# Patient Record
Sex: Male | Born: 1955 | Race: White | Hispanic: No | Marital: Single | Smoking: Never smoker
Health system: Southern US, Community
[De-identification: ages and names within clinical notes are randomized; demographics above are authoritative.]

## PROBLEM LIST (undated history)

## (undated) DIAGNOSIS — M1990 Unspecified osteoarthritis, unspecified site: Secondary | ICD-10-CM

## (undated) DIAGNOSIS — G4730 Sleep apnea, unspecified: Secondary | ICD-10-CM

## (undated) DIAGNOSIS — E7800 Pure hypercholesterolemia, unspecified: Secondary | ICD-10-CM

## (undated) DIAGNOSIS — I10 Essential (primary) hypertension: Secondary | ICD-10-CM

## (undated) DIAGNOSIS — G629 Polyneuropathy, unspecified: Secondary | ICD-10-CM

## (undated) HISTORY — PX: OTHER SURGICAL HISTORY: SHX169

## (undated) HISTORY — PX: ANKLE ARTHROPLASTY: SUR68

## (undated) HISTORY — PX: TONSILLECTOMY: SUR1361

## (undated) HISTORY — PX: COLONOSCOPY: SHX174

## (undated) HISTORY — PX: ROTATOR CUFF REPAIR: SHX139

---

## 2001-02-14 ENCOUNTER — Ambulatory Visit (HOSPITAL_BASED_OUTPATIENT_CLINIC_OR_DEPARTMENT_OTHER): Admission: RE | Admit: 2001-02-14 | Discharge: 2001-02-14 | Payer: Self-pay | Admitting: Orthopedic Surgery

## 2018-09-28 ENCOUNTER — Other Ambulatory Visit: Payer: Self-pay | Admitting: Neurological Surgery

## 2018-09-28 DIAGNOSIS — M48062 Spinal stenosis, lumbar region with neurogenic claudication: Secondary | ICD-10-CM

## 2018-10-06 ENCOUNTER — Ambulatory Visit
Admission: RE | Admit: 2018-10-06 | Discharge: 2018-10-06 | Disposition: A | Discharge status: Home / Self Care | Payer: BLUE CROSS/BLUE SHIELD | Source: Ambulatory Visit | Attending: Neurological Surgery | Admitting: Neurological Surgery

## 2018-10-06 DIAGNOSIS — M48062 Spinal stenosis, lumbar region with neurogenic claudication: Secondary | ICD-10-CM

## 2018-10-25 ENCOUNTER — Other Ambulatory Visit: Payer: Self-pay | Admitting: Neurological Surgery

## 2018-11-01 NOTE — Pre-Procedure Instructions (Signed)
Zeddie Cottrill  11/01/2018      [facility] - [place], [place] - [address] [facility] [address] [place] [address] Phone: [phone] Fax: [phone]    Your procedure is scheduled on January 31  Report to [facility] Admitting at 0530 A.M.  Call this number if you have problems the morning of surgery:  [phone]   Remember:  Do not eat or drink after midnight.    Take these medicines the morning of surgery with A SIP OF WATER  acetaminophen (TYLENOL)  If needed gabapentin (NEURONTIN)  7 days prior to surgery STOP taking anymeloxicam (MOBIC) , diclofenac (VOLTAREN),   Aspirin (unless otherwise instructed by your surgeon), Aleve, Naproxen, Ibuprofen, Motrin, Advil, Goody's, BC's, all herbal medications, fish oil, and all vitamins.     Do not wear jewelry  Do not wear lotions, powders, or cologne, or deodorant.   Men may shave face and neck.  Do not bring valuables to the hospital.  [facility] is not responsible for any belongings or valuables.  Contacts, dentures or bridgework may not be worn into surgery.  Leave your suitcase in the car.  After surgery it may be brought to your room.  For patients admitted to the hospital, discharge time will be determined by your treatment team.  Patients discharged the day of surgery will not be allowed to drive home.    Special instructions:   [facility]- Preparing For Surgery  Before surgery, you can play an important role. Because skin is not sterile, your skin needs to be as free of germs as possible. You can reduce the number of germs on your skin by washing with CHG (chlorahexidine gluconate) Soap before surgery.  CHG is an antiseptic cleaner which kills germs and bonds with the skin to continue killing germs even after washing.    Oral Hygiene is also important to reduce your risk of infection.  Remember - BRUSH YOUR TEETH THE MORNING OF SURGERY WITH YOUR REGULAR TOOTHPASTE  Please  do not use if you have an allergy to CHG or antibacterial soaps. If your skin becomes reddened/irritated stop using the CHG.  Do not shave (including legs and underarms) for at least 48 hours prior to first CHG shower. It is OK to shave your face.  Please follow these instructions carefully.   1. Shower the NIGHT BEFORE SURGERY and the MORNING OF SURGERY with CHG.   2. If you chose to wash your hair, wash your hair first as usual with your normal shampoo.  3. After you shampoo, rinse your hair and body thoroughly to remove the shampoo.  4. Use CHG as you would any other liquid soap. You can apply CHG directly to the skin and wash gently with a scrungie or a clean washcloth.   5. Apply the CHG Soap to your body ONLY FROM THE NECK DOWN.  Do not use on open wounds or open sores. Avoid contact with your eyes, ears, mouth and genitals (private parts). Wash Face and genitals (private parts)  with your normal soap.  6. Wash thoroughly, paying special attention to the area where your surgery will be performed.  7. Thoroughly rinse your body with warm water from the neck down.  8. DO NOT shower/wash with your normal soap after using and rinsing off the CHG Soap.  9. Pat yourself dry with a CLEAN TOWEL.  10. Wear CLEAN PAJAMAS to bed the night before surgery, wear  comfortable clothes the morning of surgery  11. Place CLEAN SHEETS on your bed the night of your first shower and DO NOT SLEEP WITH PETS.    Day of Surgery:  Do not apply any deodorants/lotions.  Please wear clean clothes to the hospital/surgery center.   Remember to brush your teeth WITH YOUR REGULAR TOOTHPASTE.    Please read over the following fact sheets that you were given.

## 2018-11-02 ENCOUNTER — Other Ambulatory Visit: Payer: Self-pay

## 2018-11-02 ENCOUNTER — Encounter (HOSPITAL_COMMUNITY): Payer: Self-pay

## 2018-11-02 ENCOUNTER — Encounter (HOSPITAL_COMMUNITY): Payer: Self-pay | Admitting: Physician Assistant

## 2018-11-02 ENCOUNTER — Encounter (HOSPITAL_COMMUNITY)
Admission: RE | Admit: 2018-11-02 | Discharge: 2018-11-02 | Disposition: A | Discharge status: Home / Self Care | Payer: BLUE CROSS/BLUE SHIELD | Source: Ambulatory Visit | Attending: Neurological Surgery | Admitting: Neurological Surgery

## 2018-11-02 DIAGNOSIS — I10 Essential (primary) hypertension: Secondary | ICD-10-CM | POA: Insufficient documentation

## 2018-11-02 DIAGNOSIS — Z01818 Encounter for other preprocedural examination: Secondary | ICD-10-CM | POA: Insufficient documentation

## 2018-11-02 HISTORY — DX: Pure hypercholesterolemia, unspecified: E78.00

## 2018-11-02 HISTORY — DX: Polyneuropathy, unspecified: G62.9

## 2018-11-02 HISTORY — DX: Essential (primary) hypertension: I10

## 2018-11-02 HISTORY — DX: Sleep apnea, unspecified: G47.30

## 2018-11-02 HISTORY — DX: Unspecified osteoarthritis, unspecified site: M19.90

## 2018-11-02 LAB — CBC
HEMATOCRIT: 40.6 % (ref 39.0–52.0)
HEMOGLOBIN: 12.7 g/dL — AB (ref 13.0–17.0)
MCH: 29.5 pg (ref 26.0–34.0)
MCHC: 31.3 g/dL (ref 30.0–36.0)
MCV: 94.4 fL (ref 80.0–100.0)
Platelets: 378 10*3/uL (ref 150–400)
RBC: 4.3 MIL/uL (ref 4.22–5.81)
RDW: 11.9 % (ref 11.5–15.5)
WBC: 7.1 10*3/uL (ref 4.0–10.5)
nRBC: 0 % (ref 0.0–0.2)

## 2018-11-02 LAB — BASIC METABOLIC PANEL
Anion gap: 10 (ref 5–15)
BUN: 18 mg/dL (ref 8–23)
CO2: 24 mmol/L (ref 22–32)
Calcium: 9 mg/dL (ref 8.9–10.3)
Chloride: 105 mmol/L (ref 98–111)
Creatinine, Ser: 0.8 mg/dL (ref 0.61–1.24)
GFR calc Af Amer: >60 mL/min (ref >60)
Glucose, Bld: 101 mg/dL — ABNORMAL HIGH (ref 70–99)
POTASSIUM: 3.6 mmol/L (ref 3.5–5.1)
Sodium: 139 mmol/L (ref 135–145)

## 2018-11-02 LAB — ABO/RH: ABO/RH(D): AB POS

## 2018-11-02 LAB — SURGICAL PCR SCREEN
MRSA, PCR: NEGATIVE
Staphylococcus aureus: NEGATIVE

## 2018-11-02 LAB — TYPE AND SCREEN
ABO/RH(D): AB POS
Antibody Screen: NEGATIVE

## 2018-11-02 NOTE — Progress Notes (Signed)
PCP - Elby Beck Cardiologist - denies  Chest x-ray - not needed EKG - 11/02/18 Stress Test - denies ECHO - denies Cardiac Cath - denies  Sleep Study - > 12 years CPAP - at night  Anesthesia review: NO  Patient denies shortness of breath, fever, cough and chest pain at PAT appointment   Patient verbalized understanding of instructions that were given to them at the PAT appointment. Patient was also instructed that they will need to review over the PAT instructions again at home before surgery.

## 2018-11-11 ENCOUNTER — Inpatient Hospital Stay (HOSPITAL_COMMUNITY)
Admission: RE | Admit: 2018-11-11 | Payer: BLUE CROSS/BLUE SHIELD | Source: Home / Self Care | Admitting: Neurological Surgery

## 2018-11-11 ENCOUNTER — Encounter (HOSPITAL_COMMUNITY): Admission: RE | Payer: Self-pay | Source: Home / Self Care

## 2018-11-11 SURGERY — POSTERIOR LUMBAR FUSION 3 LEVEL
Anesthesia: General | Site: Back

## 2018-11-23 ENCOUNTER — Other Ambulatory Visit: Payer: Self-pay | Admitting: Neurological Surgery

## 2018-12-08 ENCOUNTER — Other Ambulatory Visit: Payer: Self-pay

## 2018-12-08 ENCOUNTER — Encounter (HOSPITAL_COMMUNITY): Payer: Self-pay

## 2018-12-08 ENCOUNTER — Encounter (HOSPITAL_COMMUNITY)
Admission: RE | Admit: 2018-12-08 | Discharge: 2018-12-08 | Disposition: A | Discharge status: Home / Self Care | Payer: BLUE CROSS/BLUE SHIELD | Source: Ambulatory Visit | Attending: Neurological Surgery | Admitting: Neurological Surgery

## 2018-12-08 DIAGNOSIS — Z01812 Encounter for preprocedural laboratory examination: Secondary | ICD-10-CM | POA: Insufficient documentation

## 2018-12-08 LAB — CBC
HEMATOCRIT: 43.6 % (ref 39.0–52.0)
Hemoglobin: 14.1 g/dL (ref 13.0–17.0)
MCH: 30.1 pg (ref 26.0–34.0)
MCHC: 32.3 g/dL (ref 30.0–36.0)
MCV: 93.2 fL (ref 80.0–100.0)
Platelets: 306 10*3/uL (ref 150–400)
RBC: 4.68 MIL/uL (ref 4.22–5.81)
RDW: 12.9 % (ref 11.5–15.5)
WBC: 7 10*3/uL (ref 4.0–10.5)
nRBC: 0 % (ref 0.0–0.2)

## 2018-12-08 LAB — SURGICAL PCR SCREEN
MRSA, PCR: NEGATIVE
Staphylococcus aureus: NEGATIVE

## 2018-12-08 LAB — BASIC METABOLIC PANEL
Anion gap: 9 (ref 5–15)
BUN: 17 mg/dL (ref 8–23)
CO2: 25 mmol/L (ref 22–32)
CREATININE: 0.88 mg/dL (ref 0.61–1.24)
Calcium: 9 mg/dL (ref 8.9–10.3)
Chloride: 106 mmol/L (ref 98–111)
GFR calc Af Amer: >60 mL/min (ref >60)
GFR calc non Af Amer: >60 mL/min (ref >60)
Glucose, Bld: 99 mg/dL (ref 70–99)
Potassium: 3.7 mmol/L (ref 3.5–5.1)
Sodium: 140 mmol/L (ref 135–145)

## 2018-12-08 NOTE — Progress Notes (Addendum)
PCP - Elby Beck Cardiologist - N/A  Chest x-ray - N/A EKG - 11/02/18 Stress Test - n/a ECHO - n/a Cardiac Cath - n/a  Sleep Study - > twelve years CPAP -   Fasting Blood Sugar - n/a Checks Blood Sugar _____ times a day  Blood Thinner Instructions: n/a Aspirin Instructions: n/a  Anesthesia review: No  Patient denies shortness of breath, fever, cough and chest pain at PAT appointment   Patient verbalized understanding of instructions that were given to them at the PAT appointment. Patient was also instructed that they will need to review over the PAT instructions again at home before surgery.

## 2018-12-08 NOTE — Pre-Procedure Instructions (Addendum)
Tyler Christian  12/08/2018    Your procedure is scheduled on Thursday, December 15, 2018 at 7:30 AM.   Report to [facility] Entrance "A" Admitting Office at 5:30 AM.   Call this number if you have problems the morning of surgery: [phone]   Questions prior to day of surgery, please call [phone] between 8 & 4 PM.   Remember:  Do not eat or drink after midnight Wednesday, 12/14/18.  Take these medicines the morning of surgery with A SIP OF WATER: Gabapentin (Neurontin), Simvastatin (Zocor), Acetaminophen (Tylenol) - if needed  Stop NSAIDS (Ibuprofen, Diclofenac (Voltaren), Meloxicam (Mobic), etc) and Multivitamins as of today. Do not use Aspirin products (BC Powders, Goody's, etc) or Herbal medications prior to surgery.    Do not wear jewelry.  Do not wear lotions, powders, cologne or deodorant.  Men may shave face and neck.  Do not bring valuables to the hospital.  [facility] is not responsible for any belongings or valuables.  Contacts, dentures or bridgework may not be worn into surgery.  Leave your suitcase in the car.  After surgery it may be brought to your room.  For patients admitted to the hospital, discharge time will be determined by your treatment team.  [facility] - Preparing for Surgery  Before surgery, you can play an important role.  Because skin is not sterile, your skin needs to be as free of germs as possible.  You can reduce the number of germs on you skin by washing with CHG (chlorahexidine gluconate) soap before surgery.  CHG is an antiseptic cleaner which kills germs and bonds with the skin to continue killing germs even after washing.  Oral Hygiene is also important in reducing the risk of infection.  Remember to brush your teeth with your regular toothpaste the morning of surgery.  Please DO NOT use if you have an allergy to CHG or antibacterial soaps.  If your skin becomes reddened/irritated stop using the CHG and inform your nurse when you arrive  at Short Stay.  Do not shave (including legs and underarms) for at least 48 hours prior to the first CHG shower.  You may shave your face.  Please follow these instructions carefully:   1.  Shower with CHG Soap the night before surgery and the morning of Surgery.  2.  If you choose to wash your hair, wash your hair first as usual with your normal shampoo.  3.  After you shampoo, rinse your hair and body thoroughly to remove the shampoo. 4.  Use CHG as you would any other liquid soap.  You can apply chg directly to the skin and wash gently with a      scrungie or washcloth.           5.  Apply the CHG Soap to your body ONLY FROM THE NECK DOWN.   Do not use on open wounds or open sores. Avoid contact with your eyes, ears, mouth and genitals (private parts).  Wash genitals (private parts) with your normal soap.  6.  Wash thoroughly, paying special attention to the area where your surgery will be performed.  7.  Thoroughly rinse your body with warm water from the neck down.  8.  DO NOT shower/wash with your normal soap after using and rinsing off the CHG Soap.  9.  Pat yourself dry with a clean towel.            10.  Wear clean pajamas.  11.  Place clean sheets on your bed the night of your first shower and do not sleep with pets.  Day of Surgery  Shower as above. Do not apply any lotions/deodorants the morning of surgery.   Please wear clean clothes to the hospital. Remember to brush your teeth with toothpaste.   Please read over the fact sheets that you were given.

## 2018-12-14 NOTE — Anesthesia Preprocedure Evaluation (Addendum)
Anesthesia Evaluation  Patient identified by MRN, date of birth, ID band Patient awake    Reviewed: Allergy & Precautions, H&P , NPO status , Patient's Chart, lab work & pertinent test results  Airway Mallampati: III  TM Distance: >3 FB Neck ROM: Full    Dental no notable dental hx. (+) Teeth Intact, Dental Advisory Given   Pulmonary sleep apnea and Continuous Positive Airway Pressure Ventilation ,    Pulmonary exam normal breath sounds clear to auscultation       Cardiovascular Exercise Tolerance: Good hypertension, Pt. 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 Patient states he has arm and leg neuropathy. Ambulates well with ankle boot on one foot. negative psych ROS   GI/Hepatic negative GI ROS, Neg liver ROS,   Endo/Other  negative endocrine ROS  Renal/GU negative Renal ROS  negative genitourinary   Musculoskeletal  (+) Arthritis , Osteoarthritis,    Abdominal   Peds  Hematology negative hematology ROS (+)   Anesthesia Other Findings   Reproductive/Obstetrics negative OB ROS                          Anesthesia Physical Anesthesia Plan  ASA: III  Anesthesia Plan: General   Post-op Pain Management:    Induction: Intravenous  PONV Risk Score and Plan: 3 and Ondansetron, Dexamethasone and Midazolam  Airway Management Planned: Oral ETT  Additional Equipment: Arterial line  Intra-op Plan:   Post-operative Plan: Extubation in OR and Possible Post-op intubation/ventilation  Informed Consent: I have reviewed the patients History and Physical, chart, labs and discussed the procedure including the risks, benefits and alternatives for the proposed anesthesia with the patient or authorized representative who has indicated his/her understanding and acceptance.     Dental advisory given  Plan  Discussed with: CRNA  Anesthesia Plan Comments:         Anesthesia Quick Evaluation

## 2018-12-15 ENCOUNTER — Inpatient Hospital Stay (HOSPITAL_COMMUNITY): Payer: BLUE CROSS/BLUE SHIELD

## 2018-12-15 ENCOUNTER — Inpatient Hospital Stay (HOSPITAL_COMMUNITY): Payer: BLUE CROSS/BLUE SHIELD | Admitting: Anesthesiology

## 2018-12-15 ENCOUNTER — Inpatient Hospital Stay (HOSPITAL_COMMUNITY)
Admission: RE | Disposition: A | Discharge status: Home Health | Payer: Self-pay | Source: Home / Self Care | Attending: Neurological Surgery

## 2018-12-15 ENCOUNTER — Inpatient Hospital Stay (HOSPITAL_COMMUNITY)
Admission: RE | Admit: 2018-12-15 | Discharge: 2018-12-23 | DRG: 454 | Disposition: A | Discharge status: Home Health | Payer: BLUE CROSS/BLUE SHIELD | Attending: Neurological Surgery | Admitting: Neurological Surgery

## 2018-12-15 ENCOUNTER — Encounter (HOSPITAL_COMMUNITY): Payer: Self-pay | Admitting: Surgery

## 2018-12-15 ENCOUNTER — Other Ambulatory Visit: Payer: Self-pay

## 2018-12-15 DIAGNOSIS — M4316 Spondylolisthesis, lumbar region: Secondary | ICD-10-CM | POA: Diagnosis present

## 2018-12-15 DIAGNOSIS — M21372 Foot drop, left foot: Secondary | ICD-10-CM | POA: Diagnosis present

## 2018-12-15 DIAGNOSIS — R339 Retention of urine, unspecified: Secondary | ICD-10-CM | POA: Diagnosis not present

## 2018-12-15 DIAGNOSIS — M62532 Muscle wasting and atrophy, not elsewhere classified, left forearm: Secondary | ICD-10-CM | POA: Diagnosis present

## 2018-12-15 DIAGNOSIS — E7800 Pure hypercholesterolemia, unspecified: Secondary | ICD-10-CM | POA: Diagnosis present

## 2018-12-15 DIAGNOSIS — M62531 Muscle wasting and atrophy, not elsewhere classified, right forearm: Secondary | ICD-10-CM | POA: Diagnosis present

## 2018-12-15 DIAGNOSIS — D62 Acute posthemorrhagic anemia: Secondary | ICD-10-CM | POA: Diagnosis not present

## 2018-12-15 DIAGNOSIS — Z09 Encounter for follow-up examination after completed treatment for conditions other than malignant neoplasm: Secondary | ICD-10-CM

## 2018-12-15 DIAGNOSIS — M4726 Other spondylosis with radiculopathy, lumbar region: Secondary | ICD-10-CM | POA: Diagnosis present

## 2018-12-15 DIAGNOSIS — M21371 Foot drop, right foot: Secondary | ICD-10-CM | POA: Diagnosis present

## 2018-12-15 DIAGNOSIS — Z880 Allergy status to penicillin: Secondary | ICD-10-CM | POA: Diagnosis not present

## 2018-12-15 DIAGNOSIS — G600 Hereditary motor and sensory neuropathy: Secondary | ICD-10-CM | POA: Diagnosis present

## 2018-12-15 DIAGNOSIS — M4156 Other secondary scoliosis, lumbar region: Secondary | ICD-10-CM | POA: Diagnosis present

## 2018-12-15 DIAGNOSIS — G4730 Sleep apnea, unspecified: Secondary | ICD-10-CM | POA: Diagnosis present

## 2018-12-15 DIAGNOSIS — M48062 Spinal stenosis, lumbar region with neurogenic claudication: Secondary | ICD-10-CM | POA: Diagnosis present

## 2018-12-15 DIAGNOSIS — K5900 Constipation, unspecified: Secondary | ICD-10-CM | POA: Diagnosis not present

## 2018-12-15 DIAGNOSIS — Z419 Encounter for procedure for purposes other than remedying health state, unspecified: Secondary | ICD-10-CM

## 2018-12-15 DIAGNOSIS — I10 Essential (primary) hypertension: Secondary | ICD-10-CM | POA: Diagnosis present

## 2018-12-15 HISTORY — PX: APPLICATION OF ROBOTIC ASSISTANCE FOR SPINAL PROCEDURE: SHX6753

## 2018-12-15 LAB — POCT I-STAT 7, (LYTES, BLD GAS, ICA,H+H)
Bicarbonate: 25.6 mmol/L (ref 20.0–28.0)
Calcium, Ion: 1.11 mmol/L — ABNORMAL LOW (ref 1.15–1.40)
HCT: 32 % — ABNORMAL LOW (ref 39.0–52.0)
Hemoglobin: 10.9 g/dL — ABNORMAL LOW (ref 13.0–17.0)
O2 Saturation: 100 %
PO2 ART: 278 mmHg — AB (ref 83.0–108.0)
Patient temperature: 36.3
Potassium: 4.2 mmol/L (ref 3.5–5.1)
Sodium: 136 mmol/L (ref 135–145)
TCO2: 27 mmol/L (ref 22–32)
pCO2 arterial: 41.6 mmHg (ref 32.0–48.0)
pH, Arterial: 7.394 (ref 7.350–7.450)

## 2018-12-15 LAB — MRSA PCR SCREENING: MRSA by PCR: NEGATIVE

## 2018-12-15 LAB — GLUCOSE, CAPILLARY: Glucose-Capillary: 192 mg/dL — ABNORMAL HIGH (ref 70–99)

## 2018-12-15 SURGERY — POSTERIOR LUMBAR FUSION 3 LEVEL
Anesthesia: General | Site: Back

## 2018-12-15 MED ORDER — HYDROMORPHONE HCL 1 MG/ML IJ SOLN
INTRAMUSCULAR | Status: AC
  Filled 2018-12-15: qty 1

## 2018-12-15 MED ORDER — THROMBIN 5000 UNITS EX SOLR
OROMUCOSAL | Status: DC | PRN
  Administered 2018-12-15 (×6): via TOPICAL

## 2018-12-15 MED ORDER — FENTANYL CITRATE (PF) 100 MCG/2ML IJ SOLN
INTRAMUSCULAR | Status: DC | PRN
  Administered 2018-12-15: 50 ug via INTRAVENOUS
  Administered 2018-12-15: 100 ug via INTRAVENOUS
  Administered 2018-12-15 (×3): 50 ug via INTRAVENOUS
  Administered 2018-12-15: 250 ug via INTRAVENOUS
  Administered 2018-12-15 (×3): 50 ug via INTRAVENOUS

## 2018-12-15 MED ORDER — PROPOFOL 500 MG/50ML IV EMUL
INTRAVENOUS | Status: DC | PRN
  Administered 2018-12-15 (×2): 50 ug/kg/min via INTRAVENOUS

## 2018-12-15 MED ORDER — DOCUSATE SODIUM 100 MG PO CAPS
100.0000 mg | ORAL_CAPSULE | Freq: Two times a day (BID) | ORAL | Status: DC
  Administered 2018-12-15 – 2018-12-22 (×15): 100 mg via ORAL
  Filled 2018-12-15 (×15): qty 1

## 2018-12-15 MED ORDER — HYDROMORPHONE HCL 1 MG/ML IJ SOLN
0.2500 mg | INTRAMUSCULAR | Status: DC | PRN
  Administered 2018-12-15 (×3): 0.5 mg via INTRAVENOUS

## 2018-12-15 MED ORDER — LIDOCAINE-EPINEPHRINE 1 %-1:100000 IJ SOLN
INTRAMUSCULAR | Status: DC | PRN
  Administered 2018-12-15: 20 mL

## 2018-12-15 MED ORDER — SODIUM CHLORIDE 0.9% FLUSH
3.0000 mL | Freq: Two times a day (BID) | INTRAVENOUS | Status: DC
  Administered 2018-12-16 – 2018-12-22 (×8): 3 mL via INTRAVENOUS
  Administered 2018-12-22: 21:00:00 via INTRAVENOUS

## 2018-12-15 MED ORDER — DEXAMETHASONE SODIUM PHOSPHATE 10 MG/ML IJ SOLN
INTRAMUSCULAR | Status: DC | PRN
  Administered 2018-12-15: 10 mg via INTRAVENOUS

## 2018-12-15 MED ORDER — LACTATED RINGERS IV SOLN
INTRAVENOUS | Status: DC
  Administered 2018-12-15 – 2018-12-17 (×3): via INTRAVENOUS

## 2018-12-15 MED ORDER — BENAZEPRIL HCL 20 MG PO TABS
10.0000 mg | ORAL_TABLET | Freq: Every evening | ORAL | Status: DC
  Administered 2018-12-15 – 2018-12-22 (×7): 10 mg via ORAL
  Filled 2018-12-15 (×7): qty 1

## 2018-12-15 MED ORDER — PROPOFOL 10 MG/ML IV BOLUS
INTRAVENOUS | Status: AC
  Filled 2018-12-15: qty 20

## 2018-12-15 MED ORDER — KETAMINE HCL 10 MG/ML IJ SOLN
INTRAMUSCULAR | Status: DC | PRN
  Administered 2018-12-15 (×2): 10 mg via INTRAVENOUS
  Administered 2018-12-15: 40 mg via INTRAVENOUS
  Administered 2018-12-15 (×4): 10 mg via INTRAVENOUS

## 2018-12-15 MED ORDER — VANCOMYCIN HCL 10 G IV SOLR
1250.0000 mg | Freq: Two times a day (BID) | INTRAVENOUS | Status: DC
  Administered 2018-12-15 – 2018-12-16 (×2): 1250 mg via INTRAVENOUS
  Filled 2018-12-15 (×2): qty 1250

## 2018-12-15 MED ORDER — ALUM & MAG HYDROXIDE-SIMETH 200-200-20 MG/5ML PO SUSP: 30.0000 mL | Freq: Four times a day (QID) | ORAL | Status: DC | PRN

## 2018-12-15 MED ORDER — PHENYLEPHRINE 40 MCG/ML (10ML) SYRINGE FOR IV PUSH (FOR BLOOD PRESSURE SUPPORT)
PREFILLED_SYRINGE | INTRAVENOUS | Status: AC
  Filled 2018-12-15: qty 20

## 2018-12-15 MED ORDER — LIDOCAINE HCL (CARDIAC) PF 100 MG/5ML IV SOSY
PREFILLED_SYRINGE | INTRAVENOUS | Status: DC | PRN
  Administered 2018-12-15: 60 mg via INTRAVENOUS

## 2018-12-15 MED ORDER — BUPIVACAINE HCL (PF) 0.5 % IJ SOLN
INTRAMUSCULAR | Status: DC | PRN
  Administered 2018-12-15: 20 mL

## 2018-12-15 MED ORDER — POLYETHYLENE GLYCOL 3350 17 G PO PACK
17.0000 g | PACK | Freq: Every day | ORAL | Status: DC | PRN
  Administered 2018-12-17: 17 g via ORAL
  Filled 2018-12-15: qty 1

## 2018-12-15 MED ORDER — ONDANSETRON HCL 4 MG/2ML IJ SOLN
INTRAMUSCULAR | Status: AC
  Filled 2018-12-15: qty 2

## 2018-12-15 MED ORDER — ONDANSETRON HCL 4 MG/2ML IJ SOLN: 4.0000 mg | Freq: Four times a day (QID) | INTRAMUSCULAR | Status: DC | PRN

## 2018-12-15 MED ORDER — FENTANYL CITRATE (PF) 100 MCG/2ML IJ SOLN: INTRAMUSCULAR | Status: DC | PRN

## 2018-12-15 MED ORDER — THROMBIN 5000 UNITS EX SOLR
CUTANEOUS | Status: AC
  Filled 2018-12-15: qty 10000

## 2018-12-15 MED ORDER — THROMBIN 20000 UNITS EX SOLR
CUTANEOUS | Status: AC
  Filled 2018-12-15: qty 20000

## 2018-12-15 MED ORDER — METHOCARBAMOL 1000 MG/10ML IJ SOLN
500.0000 mg | Freq: Four times a day (QID) | INTRAVENOUS | Status: DC | PRN
  Administered 2018-12-16: 500 mg via INTRAVENOUS
  Filled 2018-12-15: qty 5
  Filled 2018-12-15: qty 500

## 2018-12-15 MED ORDER — CHLORHEXIDINE GLUCONATE CLOTH 2 % EX PADS: 6.0000 | MEDICATED_PAD | Freq: Once | CUTANEOUS | Status: DC

## 2018-12-15 MED ORDER — FENTANYL CITRATE (PF) 250 MCG/5ML IJ SOLN
INTRAMUSCULAR | Status: AC
  Filled 2018-12-15: qty 5

## 2018-12-15 MED ORDER — MIDAZOLAM HCL 5 MG/5ML IJ SOLN
INTRAMUSCULAR | Status: DC | PRN
  Administered 2018-12-15: 2 mg via INTRAVENOUS

## 2018-12-15 MED ORDER — SODIUM CHLORIDE 0.9 % IV SOLN
INTRAVENOUS | Status: DC | PRN
  Administered 2018-12-15: 50 ug/min via INTRAVENOUS
  Administered 2018-12-15: 25 ug/min via INTRAVENOUS

## 2018-12-15 MED ORDER — 0.9 % SODIUM CHLORIDE (POUR BTL) OPTIME
TOPICAL | Status: DC | PRN
  Administered 2018-12-15 (×2): 1000 mL

## 2018-12-15 MED ORDER — BUPIVACAINE HCL (PF) 0.5 % IJ SOLN
INTRAMUSCULAR | Status: AC
  Filled 2018-12-15: qty 30

## 2018-12-15 MED ORDER — LIDOCAINE HCL (CARDIAC) PF 100 MG/5ML IV SOSY: PREFILLED_SYRINGE | INTRAVENOUS | Status: DC | PRN

## 2018-12-15 MED ORDER — SENNA 8.6 MG PO TABS
1.0000 | ORAL_TABLET | Freq: Two times a day (BID) | ORAL | Status: DC
  Administered 2018-12-15 – 2018-12-22 (×15): 8.6 mg via ORAL
  Filled 2018-12-15 (×15): qty 1

## 2018-12-15 MED ORDER — MORPHINE SULFATE (PF) 2 MG/ML IV SOLN
2.0000 mg | INTRAVENOUS | Status: DC | PRN
  Administered 2018-12-15 – 2018-12-22 (×26): 2 mg via INTRAVENOUS
  Filled 2018-12-15 (×26): qty 1

## 2018-12-15 MED ORDER — ACETAMINOPHEN 500 MG PO TABS
1000.0000 mg | ORAL_TABLET | Freq: Once | ORAL | Status: DC
  Filled 2018-12-15: qty 2

## 2018-12-15 MED ORDER — PROPOFOL 10 MG/ML IV BOLUS
INTRAVENOUS | Status: DC | PRN
  Administered 2018-12-15: 130 mg via INTRAVENOUS

## 2018-12-15 MED ORDER — FLEET ENEMA 7-19 GM/118ML RE ENEM
1.0000 | ENEMA | Freq: Once | RECTAL | Status: AC | PRN
  Administered 2018-12-18: 1 via RECTAL
  Filled 2018-12-15: qty 1

## 2018-12-15 MED ORDER — EPHEDRINE 5 MG/ML INJ
INTRAVENOUS | Status: AC
  Filled 2018-12-15: qty 10

## 2018-12-15 MED ORDER — KETAMINE HCL 50 MG/5ML IJ SOSY
PREFILLED_SYRINGE | INTRAMUSCULAR | Status: AC
  Filled 2018-12-15: qty 10

## 2018-12-15 MED ORDER — ONDANSETRON HCL 4 MG PO TABS: 4.0000 mg | ORAL_TABLET | Freq: Four times a day (QID) | ORAL | Status: DC | PRN

## 2018-12-15 MED ORDER — SODIUM CHLORIDE 0.9% FLUSH
3.0000 mL | INTRAVENOUS | Status: DC | PRN
  Administered 2018-12-21: 3 mL via INTRAVENOUS
  Filled 2018-12-15: qty 3

## 2018-12-15 MED ORDER — BISACODYL 10 MG RE SUPP: 10.0000 mg | Freq: Every day | RECTAL | Status: DC | PRN

## 2018-12-15 MED ORDER — DEXAMETHASONE SODIUM PHOSPHATE 10 MG/ML IJ SOLN
INTRAMUSCULAR | Status: AC
  Filled 2018-12-15: qty 1

## 2018-12-15 MED ORDER — EPHEDRINE SULFATE-NACL 50-0.9 MG/10ML-% IV SOSY
PREFILLED_SYRINGE | INTRAVENOUS | Status: DC | PRN
  Administered 2018-12-15: 5 mg via INTRAVENOUS
  Administered 2018-12-15 (×2): 10 mg via INTRAVENOUS

## 2018-12-15 MED ORDER — SODIUM CHLORIDE 0.9 % IV SOLN
INTRAVENOUS | Status: DC | PRN
  Administered 2018-12-15: 10:00:00

## 2018-12-15 MED ORDER — LIDOCAINE 2% (20 MG/ML) 5 ML SYRINGE
INTRAMUSCULAR | Status: AC
  Filled 2018-12-15: qty 5

## 2018-12-15 MED ORDER — LACTATED RINGERS IV SOLN
INTRAVENOUS | Status: DC | PRN
  Administered 2018-12-15 (×3): via INTRAVENOUS

## 2018-12-15 MED ORDER — SIMVASTATIN 20 MG PO TABS
40.0000 mg | ORAL_TABLET | Freq: Every day | ORAL | Status: DC
  Administered 2018-12-15 – 2018-12-23 (×9): 40 mg via ORAL
  Filled 2018-12-15 (×9): qty 2

## 2018-12-15 MED ORDER — ACETAMINOPHEN 325 MG PO TABS
650.0000 mg | ORAL_TABLET | ORAL | Status: DC | PRN
  Administered 2018-12-16 – 2018-12-21 (×5): 650 mg via ORAL
  Filled 2018-12-15 (×5): qty 2

## 2018-12-15 MED ORDER — PHENOL 1.4 % MT LIQD: 1.0000 | OROMUCOSAL | Status: DC | PRN

## 2018-12-15 MED ORDER — THROMBIN 5000 UNITS EX SOLR: OROMUCOSAL | Status: DC | PRN

## 2018-12-15 MED ORDER — SODIUM CHLORIDE 0.9 % IV SOLN
250.0000 mL | INTRAVENOUS | Status: DC
  Administered 2018-12-16: 250 mL via INTRAVENOUS

## 2018-12-15 MED ORDER — METHOCARBAMOL 500 MG PO TABS
500.0000 mg | ORAL_TABLET | Freq: Four times a day (QID) | ORAL | Status: DC | PRN
  Administered 2018-12-15 – 2018-12-23 (×20): 500 mg via ORAL
  Filled 2018-12-15 (×20): qty 1

## 2018-12-15 MED ORDER — GABAPENTIN 300 MG PO CAPS
300.0000 mg | ORAL_CAPSULE | ORAL | Status: DC
  Administered 2018-12-16: 300 mg via ORAL
  Filled 2018-12-15: qty 1

## 2018-12-15 MED ORDER — PROPOFOL 10 MG/ML IV BOLUS: INTRAVENOUS | Status: DC | PRN

## 2018-12-15 MED ORDER — MENTHOL 3 MG MT LOZG: 1.0000 | LOZENGE | OROMUCOSAL | Status: DC | PRN

## 2018-12-15 MED ORDER — LACTATED RINGERS IV SOLN
INTRAVENOUS | Status: DC | PRN
  Administered 2018-12-15 (×2): via INTRAVENOUS

## 2018-12-15 MED ORDER — MIDAZOLAM HCL 2 MG/2ML IJ SOLN
INTRAMUSCULAR | Status: AC
  Filled 2018-12-15: qty 2

## 2018-12-15 MED ORDER — VANCOMYCIN HCL IN DEXTROSE 1-5 GM/200ML-% IV SOLN
1000.0000 mg | INTRAVENOUS | Status: AC
  Administered 2018-12-15: 1000 mg via INTRAVENOUS
  Filled 2018-12-15: qty 200

## 2018-12-15 MED ORDER — SUCCINYLCHOLINE CHLORIDE 200 MG/10ML IV SOSY
PREFILLED_SYRINGE | INTRAVENOUS | Status: DC | PRN
  Administered 2018-12-15: 100 mg via INTRAVENOUS

## 2018-12-15 MED ORDER — SODIUM CHLORIDE 0.9 % IV SOLN
INTRAVENOUS | Status: DC | PRN
  Administered 2018-12-15: 14:00:00 via INTRAVENOUS

## 2018-12-15 MED ORDER — ACETAMINOPHEN 650 MG RE SUPP: 650.0000 mg | RECTAL | Status: DC | PRN

## 2018-12-15 MED ORDER — KETOROLAC TROMETHAMINE 15 MG/ML IJ SOLN
15.0000 mg | Freq: Four times a day (QID) | INTRAMUSCULAR | Status: AC
  Administered 2018-12-15 – 2018-12-16 (×4): 15 mg via INTRAVENOUS
  Filled 2018-12-15 (×4): qty 1

## 2018-12-15 MED ORDER — LIDOCAINE-EPINEPHRINE 1 %-1:100000 IJ SOLN
INTRAMUSCULAR | Status: AC
  Filled 2018-12-15: qty 1

## 2018-12-15 SURGICAL SUPPLY — 101 items
BAG DECANTER FOR FLEXI CONT (MISCELLANEOUS) ×4 IMPLANT
BASKET BONE COLLECTION (BASKET) ×4 IMPLANT
BIT DRILL LONG 3.0X30 (BIT) ×3 IMPLANT
BIT DRILL LONG 3.0X30MM (BIT) ×1
BIT DRILL LONG 3X80 (BIT) IMPLANT
BIT DRILL LONG 3X80MM (BIT)
BIT DRILL LONG 4X80 (BIT) ×3 IMPLANT
BIT DRILL LONG 4X80MM (BIT) ×1
BIT DRILL SHORT 3.0X30 (BIT) IMPLANT
BIT DRILL SHORT 3.0X30MM (BIT)
BIT DRILL SHORT 3X80 (BIT) IMPLANT
BIT DRILL SHORT 3X80MM (BIT)
BLADE CLIPPER SURG (BLADE) ×4 IMPLANT
BLADE SURG 11 STRL SS (BLADE) ×4 IMPLANT
BUR MATCHSTICK NEURO 3.0 LAGG (BURR) ×4 IMPLANT
CAGE COROENT 11X9X23-8 (Cage) ×8 IMPLANT
CAGE COROENT LG 10X9X23-12 (Cage) ×8 IMPLANT
CAGE COROENT MP 11X23X9 (Cage) ×8 IMPLANT
CANISTER SUCT 3000ML PPV (MISCELLANEOUS) ×8 IMPLANT
CLIP NEUROVISION LG (CLIP) ×4 IMPLANT
CONT SPEC 4OZ CLIKSEAL STRL BL (MISCELLANEOUS) ×4 IMPLANT
COVER BACK TABLE 60X90IN (DRAPES) ×4 IMPLANT
COVER WAND RF STERILE (DRAPES) IMPLANT
DECANTER SPIKE VIAL GLASS SM (MISCELLANEOUS) ×4 IMPLANT
DERMABOND ADVANCED (GAUZE/BANDAGES/DRESSINGS) ×2
DERMABOND ADVANCED .7 DNX12 (GAUZE/BANDAGES/DRESSINGS) ×2 IMPLANT
DEVICE DISSECT PLASMABLAD 3.0S (MISCELLANEOUS) ×2 IMPLANT
DIGITIZER BENDINI (MISCELLANEOUS) ×4 IMPLANT
DRAPE C-ARM 42X72 X-RAY (DRAPES) ×4 IMPLANT
DRAPE HALF SHEET 40X57 (DRAPES) ×4 IMPLANT
DRAPE LAPAROTOMY 100X72X124 (DRAPES) ×4 IMPLANT
DRAPE SHEET LG 3/4 BI-LAMINATE (DRAPES) ×4 IMPLANT
DRSG OPSITE POSTOP 4X10 (GAUZE/BANDAGES/DRESSINGS) ×4 IMPLANT
DRSG OPSITE POSTOP 4X6 (GAUZE/BANDAGES/DRESSINGS) ×8 IMPLANT
DURAPREP 26ML APPLICATOR (WOUND CARE) ×4 IMPLANT
DURASEAL APPLICATOR TIP (TIP) IMPLANT
DURASEAL SPINE SEALANT 3ML (MISCELLANEOUS) IMPLANT
ELECT BLADE 4.0 EZ CLEAN MEGAD (MISCELLANEOUS)
ELECT REM PT RETURN 9FT ADLT (ELECTROSURGICAL) ×4
ELECTRODE BLDE 4.0 EZ CLN MEGD (MISCELLANEOUS) IMPLANT
ELECTRODE REM PT RTRN 9FT ADLT (ELECTROSURGICAL) ×2 IMPLANT
EVACUATOR 3/16  PVC DRAIN (DRAIN) ×2
EVACUATOR 3/16 PVC DRAIN (DRAIN) ×2 IMPLANT
GAUZE 4X4 16PLY RFD (DISPOSABLE) ×12 IMPLANT
GAUZE SPONGE 4X4 12PLY STRL (GAUZE/BANDAGES/DRESSINGS) IMPLANT
GLOVE BIO SURGEON STRL SZ 6.5 (GLOVE) ×6 IMPLANT
GLOVE BIO SURGEONS STRL SZ 6.5 (GLOVE) ×2
GLOVE BIOGEL PI IND STRL 6.5 (GLOVE) ×6 IMPLANT
GLOVE BIOGEL PI IND STRL 7.0 (GLOVE) ×2 IMPLANT
GLOVE BIOGEL PI IND STRL 8.5 (GLOVE) ×4 IMPLANT
GLOVE BIOGEL PI INDICATOR 6.5 (GLOVE) ×6
GLOVE BIOGEL PI INDICATOR 7.0 (GLOVE) ×2
GLOVE BIOGEL PI INDICATOR 8.5 (GLOVE) ×4
GLOVE ECLIPSE 8.5 STRL (GLOVE) ×8 IMPLANT
GLOVE SURG SS PI 6.0 STRL IVOR (GLOVE) ×20 IMPLANT
GLOVE SURG SS PI 7.0 STRL IVOR (GLOVE) ×24 IMPLANT
GLOVE SURG SS PI 7.5 STRL IVOR (GLOVE) ×16 IMPLANT
GOWN STRL REUS W/ TWL LRG LVL3 (GOWN DISPOSABLE) ×10 IMPLANT
GOWN STRL REUS W/ TWL XL LVL3 (GOWN DISPOSABLE) IMPLANT
GOWN STRL REUS W/TWL 2XL LVL3 (GOWN DISPOSABLE) ×8 IMPLANT
GOWN STRL REUS W/TWL LRG LVL3 (GOWN DISPOSABLE) ×10
GOWN STRL REUS W/TWL XL LVL3 (GOWN DISPOSABLE)
GUIDEWIRE NITINOL BEVEL TIP (WIRE) ×40 IMPLANT
HEMOSTAT POWDER KIT SURGIFOAM (HEMOSTASIS) ×24 IMPLANT
KIT BASIN OR (CUSTOM PROCEDURE TRAY) ×8 IMPLANT
KIT INFUSE SMALL (Orthopedic Implant) ×4 IMPLANT
KIT SPINE MAZOR X ROBO DISP (MISCELLANEOUS) ×4 IMPLANT
KIT TURNOVER KIT B (KITS) ×4 IMPLANT
MILL MEDIUM DISP (BLADE) ×4 IMPLANT
MODULE NVM5 NEXT GEN EMG (NEEDLE) ×4 IMPLANT
NEEDLE HYPO 22GX1.5 SAFETY (NEEDLE) ×4 IMPLANT
NS IRRIG 1000ML POUR BTL (IV SOLUTION) ×8 IMPLANT
PACK LAMINECTOMY NEURO (CUSTOM PROCEDURE TRAY) ×4 IMPLANT
PAD ARMBOARD 7.5X6 YLW CONV (MISCELLANEOUS) ×12 IMPLANT
PATTIES SURGICAL .5 X1 (DISPOSABLE) ×4 IMPLANT
PATTIES SURGICAL 1X1 (DISPOSABLE) ×4 IMPLANT
PIN HEAD 2.5X60MM (PIN) ×4 IMPLANT
PLASMABLADE 3.0S (MISCELLANEOUS) ×4
ROD RELINE-O 5.5X300 STRT NS (Rod) ×2 IMPLANT
ROD RELINE-O 5.5X300MM STRT (Rod) ×2 IMPLANT
SCREW CERV PA RELINE 8.5X80 (Screw) ×4 IMPLANT
SCREW CERV RELINE PA 8.5X70 (Screw) ×4 IMPLANT
SCREW LOCK RELINE 5.5 TULIP (Screw) ×40 IMPLANT
SCREW MAS RELINE 6.5X50 POLY (Screw) ×8 IMPLANT
SCREW MAS RELINE 6.5X55 POLY (Screw) ×16 IMPLANT
SCREW RELINE MAS 7.5X50MM POLY (Screw) ×8 IMPLANT
SCREW SCHANZ SA 4.0MM (MISCELLANEOUS) ×4 IMPLANT
SPONGE LAP 4X18 RFD (DISPOSABLE) ×4 IMPLANT
SPONGE NEURO XRAY DETECT 1X3 (DISPOSABLE) ×4 IMPLANT
SPONGE SURGIFOAM ABS GEL 100 (HEMOSTASIS) IMPLANT
SUT PROLENE 6 0 BV (SUTURE) IMPLANT
SUT VIC AB 1 CT1 18XBRD ANBCTR (SUTURE) ×4 IMPLANT
SUT VIC AB 1 CT1 8-18 (SUTURE) ×4
SUT VIC AB 2-0 CP2 18 (SUTURE) ×8 IMPLANT
SUT VIC AB 3-0 SH 8-18 (SUTURE) ×16 IMPLANT
SYR 3ML LL SCALE MARK (SYRINGE) ×16 IMPLANT
TOWEL GREEN STERILE (TOWEL DISPOSABLE) ×8 IMPLANT
TOWEL GREEN STERILE FF (TOWEL DISPOSABLE) ×4 IMPLANT
TRAY FOLEY MTR SLVR 16FR STAT (SET/KITS/TRAYS/PACK) ×4 IMPLANT
TUBE MAZOR SA REDUCTION (TUBING) ×4 IMPLANT
WATER STERILE IRR 1000ML POUR (IV SOLUTION) ×8 IMPLANT

## 2018-12-15 NOTE — Progress Notes (Signed)
Pt placed himself on CPAP with O2 bleed in of 1.5 L by RRT .  Pt tolerating at this time.

## 2018-12-15 NOTE — H&P (Signed)
Tyler Christian is an 63 y.o. male.   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  Over the years he is developed a degenerative scoliosis that has been treated with combinations of physical therapy and chiropractic manipulation more recently however he is developed severe lumbar radiculopathy and symptoms of neurogenic claudication that prevents him from being up on his feet for any length of time.  He notes that even sitting is uncomfortable.  He can rest comfortably for brief periods of time in the supine position turning on his sides is difficult also.  He has evidence of a degenerative scoliosis in the lower lumbar spine with severe areas of stenosis at L3445 and 5 1.  He is been advised regarding limited decompression of his lumbar spine and stabilization of these lower segments and is now admitted for that procedure.  Past Medical History:  Diagnosis Date  . Arthritis   . High cholesterol   . Hypertension   . Neuropathy   . Sleep apnea    CPAP at night    Past Surgical History:  Procedure Laterality Date  . ANKLE ARTHROPLASTY Bilateral   . COLONOSCOPY    . menicus repair Right   . ROTATOR CUFF REPAIR Left   . ROTATOR CUFF REPAIR Right    wrist  . TONSILLECTOMY      History reviewed. No pertinent family history. Social History:  reports that he has never smoked. He has never used smokeless tobacco. He reports current alcohol use. He reports that he does not use drugs.  Allergies:  Allergies  Allergen Reactions  . Penicillins Shortness Of Breath and Swelling    Did it involve swelling of the face/tongue/throat, SOB, or low BP? Yes Did it involve sudden or severe rash/hives, skin peeling, or any reaction on the inside of your mouth or nose? No Did you need to seek medical attention at a hospital or doctor's office? No When did it  last happen?40 years ago If all above answers are "NO", may proceed with cephalosporin use.    Marland Kitchen Hydrocodone Other (See Comments)    Makes pt feel spaced out  . Oxycodone Other (See Comments)    Makes pt feel spaced out    Medications Prior to Admission  Medication Sig Dispense Refill  . acetaminophen (TYLENOL) 650 MG CR tablet Take 650 mg by mouth every 8 (eight) hours as needed for pain.    . benazepril (LOTENSIN) 10 MG tablet Take 10 mg by mouth every evening.    . ciprofloxacin (CIPRO) 500 MG tablet Take 500 mg by mouth 2 (two) times daily. STARTED 12/06/2018 (20 TABS) 10 DAYS 12/16/2018    . clindamycin (CLEOCIN) 150 MG capsule Take 150 mg by mouth 3 (three) times daily. STARTED MED 12/06/2018 (20 TABS) 12/13/2018 (COMPLETED)    . diclofenac (VOLTAREN) 75 MG EC tablet Take 75 mg by mouth 2 (two) times daily.    Marland Kitchen gabapentin (NEURONTIN) 300 MG capsule Take 300 mg by mouth every morning.    Marland Kitchen ibuprofen (ADVIL,MOTRIN) 200 MG tablet Take 600 mg by mouth daily as needed for moderate pain.    . Multiple Vitamin (MULTIVITAMIN WITH MINERALS) TABS tablet Take 1 tablet by mouth daily.    . simvastatin (ZOCOR) 40 MG tablet Take 40 mg by mouth daily.    . meloxicam (MOBIC) 15 MG tablet Take 15 mg by mouth daily  as needed for pain.      No results found for this or any previous visit (from the past 48 hour(s)). No results found.  Review of Systems  Constitutional: Negative.   HENT: Negative.   Eyes: Negative.   Respiratory: Negative.   Cardiovascular: Negative.   Gastrointestinal: Negative.   Genitourinary: Negative.   Musculoskeletal: Positive for back pain.  Skin: Negative.   Neurological: Positive for tingling, sensory change and weakness.       History of Charcot-Marie-Tooth neuropathy  Endo/Heme/Allergies: Negative.   Psychiatric/Behavioral: Negative.     Blood pressure (!) 185/95, pulse 63, temperature 97.8 F (36.6 C), temperature source Oral, resp. rate 20, height 5\' 8"   (1.727 m), weight 90.7 kg, SpO2 98 %. Physical Exam  Constitutional: He is oriented to person, place, and time. He appears well-developed and well-nourished.  HENT:  Head: Normocephalic and atraumatic.  Eyes: Pupils are equal, round, and reactive to light. Conjunctivae and EOM are normal.  Neck: Normal range of motion. Neck supple.  Cardiovascular: Normal rate and regular rhythm.  Respiratory: Effort normal and breath sounds normal.  GI: Soft. Bowel sounds are normal.  Musculoskeletal:     Comments: Marked atrophy in the upper extremities in the thenar and hyperthenar eminence weakness in the forearms with atrophy in the forearm musculature.  Distally he has notable weakness in the tibialis anterior and gastroc groups bilaterally with bilateral foot drops.  Neurological: He is alert and oriented to person, place, and time.  Absent deep tendon reflexes in the biceps triceps patella and the Achilles Babinski's are downgoing Station and gait are characterized by notable weakness in the distal musculature with bilateral foot drops bilateral gastroc weakness he walks with a wide-based magnetic gait.  Upper extremity strength is noted that is lacking distally in the upper extremities with marked atrophy in the thenar musculature and hyperthenar musculature.  Skin: Skin is warm and dry.  Psychiatric: He has a normal mood and affect. His behavior is normal. Judgment and thought content normal.     Assessment/Plan Degenerative scoliosis, spondylolisthesis, lumbar stenosis, lumbar radiculopathy.  History of Charcot-Marie-Tooth neuropathy.  Plan, decompression and stabilization with derotation of the lumbar spine from L3 to the sacrum.  Stefani Dama, MD 12/15/2018, 7:44 AM

## 2018-12-15 NOTE — Progress Notes (Signed)
Pharmacy Antibiotic Note  Tyler Christian is a 63 y.o. male admitted on 12/15/2018 with spondylosis with stenosis. Now s/p surgery with drain in place.  Pharmacy has been consulted for vancomycin dosing.  Plan: Start vancomycin 1,250mg  IV Q12h Monitor clinical picture, renal function, vanc trough prn F/U C&S, abx deescalation / LOT   Height: 5\' 8"  (172.7 cm) Weight: 200 lb (90.7 kg) IBW/kg (Calculated) : 68.4  Temp (24hrs), Avg:97.7 F (36.5 C), Min:97.4 F (36.3 C), Max:97.8 F (36.6 C)  No results for input(s): WBC, CREATININE, LATICACIDVEN, VANCOTROUGH, VANCOPEAK, VANCORANDOM, GENTTROUGH, GENTPEAK, GENTRANDOM, TOBRATROUGH, TOBRAPEAK, TOBRARND, AMIKACINPEAK, AMIKACINTROU, AMIKACIN in the last 168 hours.  Estimated Creatinine Clearance: 95.2 mL/min (by C-G formula based on SCr of 0.88 mg/dL).    Allergies  Allergen Reactions  . Penicillins Shortness Of Breath and Swelling    Did it involve swelling of the face/tongue/throat, SOB, or low BP? Yes Did it involve sudden or severe rash/hives, skin peeling, or any reaction on the inside of your mouth or nose? No Did you need to seek medical attention at a hospital or doctor's office? No When did it last happen?40 years ago If all above answers are "NO", may proceed with cephalosporin use.    Marland Kitchen Hydrocodone Other (See Comments)    Makes pt feel spaced out  . Oxycodone Other (See Comments)    Makes pt feel spaced out    Thank you for allowing pharmacy to be a part of this patient's care.  Armandina Stammer 12/15/2018 8:25 PM

## 2018-12-15 NOTE — Op Note (Signed)
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  Neurogenic claudication, lumbar radiculopathy Postoperative diagnosis: Same Procedure: Percutaneous pedicle screw placement from L3-S2 in the ileum laminectomy of L3-L4 and L5 with decompression of the common dural tube and L3 the L4 the L5 nerve roots.  Posterior lumbar arthrodesis L3-4 L4-5 L5-S1 with peek spacers local autograft and infuse.  Posterior lateral arthrodesis L3-4 L4-5 L5-S1 with local autograft and infuse. Neuro monitoring, robotically assisted screw placement. Surgeon: Barnett Abu First Assistant: Maeola Harman, MD Anesthesia: General endotracheal Indications: Tyler Christian is a 63 year old individuals had significant back bilateral lower extremity pain and weakness he has an underlying history of Charcot-Marie-Tooth neuropathy that he has had lifelong.  He has marked weakness in the distal lower extremities but he is found it difficult to walk any distance and he has a high-grade stenosis in the lower lumbar spine.  After careful evaluation I advised surgical decompression and stabilization from L3 down to the sacrum and ilium.  He is now admitted for that procedure.  Procedure: Patient was brought to the operating room supine on a stretcher.  After the smooth induction of general endotracheal anesthesia he was carefully turned prone.  Back was prepped with alcohol DuraPrep and draped in a sterile fashion.  Robotic arm was attached to a Beckemeyer table on which the patient was positioned prone.  Then by securing the robotic arm to the patient through a Steinmann pin placed in the posterior superior iliac crest registration of the arm to the patient's radiographs was performed in AP and oblique projection.  Once the registration was verified a preplanned program of placement of screws from L3-S2 in the ileum was enforced.  The entry sites on the skin were first drawn out into  paramedian incisions were created where the pads for the screws would be placed.  Then sequentially L3-L4-L5 and S1 screws were placed first on the right side by drilling into the pedicles and placing K wires over which the screws were placed without tapping except for in the sacrum were 7-1/2 mm screw was placed and into the ileum where an 8/2 mm diameter screw was placed.  Here we used a tap and each screw was placed with neural monitoring being performed through the screw itself to see if there is any evidence of cut out to the neural elements.  None was identified.  Screws that were placed included 6.5 x 50 mm screws in L3 6.5 x 55 mm screws in L4 and L5 7.5 x 50 mm screws in S1 and 8.5 x 70 mm screws on the left side at S2 and 8.5 x 80 mm screw on the right side.  Once this was completed the paramedian incisions were packed off in the midline incision was created and carried down to the lumbodorsal fascia fascia was opened on either side of midline to expose the spinous processes from the sacrum through L3.  Then a laminectomy was created removing the spinous processes of S1 and L5 and L4.  Laminectomy was then drilled out further using a high-speed drill to remove the inferior margin lamina of L3 out to and including the entirety of the facet at L3-4.  Entire laminar arch of L4 was then removed in a sequential fashion with the laminar arch of L5 also being removed there was noted to be severe stenosis in the central portion of the canal and the lateral recesses at each level a bit worse on the left than  on the right side.  Individually the L3-L4-L5 and the S1 nerve roots were decompressed using a combination of a high-speed drill 2 and 3 mm Kerrison punches.  Ultimately the disc spaces were isolated and then sequentially total discectomies were performed at L5-S1 L4-L5 and L3-L4 the endplates were curettaged until all the disc was removed and once opened the space was obtained interspace was sized for  appropriately sized spacer and an 11 x 9 x 23 mm spacer with 8 degrees of lordosis was placed at L5-S1 10 x 9 x 23 mm spacer with 12 degrees lordosis was placed at L4-5 and 11 x 9 x 23 mm spacer with 4 degrees of lordosis was placed at L3-4.  In addition 9 cc of bone graft was packed into the interspace at L5-S1 9 cc at L4-L5 and 6 cc at L3-L4.  Once the bone graft was packed then Bandini was used as to repeat a reduced construct from L3-S2.  This achieved approximately 75% reduction in the curvature as measured by the Bandini.  Rods were cut for the right side and the left side lateral gutters were decorticated and the remainder of autograft and infuse was packed into the lateral gutters between L3-4 L4-5 L5-S1.  Total of 18 cc was packed in either lateral gutter.  With this the system was tightened to its final position final radiographs were obtained in AP and lateral projection.  Modest reduction of this scoliosis was obtained but in the end the L3-L4 and L5 nerve roots could each be palpated on either side exiting freely.  With this the retractors were removed hemostasis in the soft tissues was obtained and the lumbodorsal fascia was closed with #1 Vicryl interrupted fashion 2-0 Vicryl was used in the subcutaneous tissues and 3-0 Vicryl subcuticularly.  The paramedian incisions were closed with 2-0 and 3-0 Vicryl.  The Steinmann pin which had been removed earlier was closed with a singular 3 oh stitch.  Blood loss for the entire procedure was estimated 1200 cc with 500 cc of Cell Saver blood being returned to the patient the total estimated of blood loss was therefore 700 cc the patient was returned to recovery room in stable condition.

## 2018-12-15 NOTE — Anesthesia Procedure Notes (Signed)
Arterial Line Insertion Start/End3/02/2019 7:05 AM, 12/15/2018 7:15 AM Performed by: Elliot Dally, CRNA, CRNA  Preanesthetic checklist: patient identified, IV checked, risks and benefits discussed, surgical consent, monitors and equipment checked and pre-op evaluation Lidocaine 1% used for infiltration Left, radial was placed Catheter size: 20 G Hand hygiene performed  and maximum sterile barriers used   Attempts: 1 Procedure performed without using ultrasound guided technique. Following insertion, dressing applied and Biopatch. Post procedure assessment: normal  Patient tolerated the procedure well with no immediate complications.

## 2018-12-15 NOTE — Progress Notes (Signed)
Patient ID: Tyler Christian, male   DOB: 07-10-1956, 63 y.o.   MRN: [id number] Vital signs are stable. Motor function appears no worse than it was preoperatively Incision is clean and dry Stable postop

## 2018-12-15 NOTE — Anesthesia Procedure Notes (Signed)
Procedure Name: Intubation Date/Time: 12/15/2018 7:54 AM Performed by: Lovie Chol, CRNA Pre-anesthesia Checklist: Patient identified, Emergency Drugs available, Suction available and Patient being monitored Patient Re-evaluated:Patient Re-evaluated prior to induction Oxygen Delivery Method: Circle System Utilized Preoxygenation: Pre-oxygenation with 100% oxygen Induction Type: IV induction Ventilation: Mask ventilation without difficulty Laryngoscope Size: Miller and 3 Grade View: Grade I Tube type: Oral Tube size: 7.5 mm Number of attempts: 1 Airway Equipment and Method: Stylet and Oral airway Placement Confirmation: ETT inserted through vocal cords under direct vision,  positive ETCO2 and breath sounds checked- equal and bilateral Secured at: 23 cm Tube secured with: Tape Dental Injury: Teeth and Oropharynx as per pre-operative assessment

## 2018-12-15 NOTE — Transfer of Care (Signed)
Immediate Anesthesia Transfer of Care Note  Patient: Tyler Christian  Procedure(s) Performed: Lumbar three-four Lumbar four-five Lumbar five-Sacral one Posterior lumbar interbody fusion with percutaneous screws Lumbar three to Ilium with MAZOR (N/A Back) APPLICATION OF ROBOTIC ASSISTANCE FOR SPINAL PROCEDURE (N/A )  Patient Location: PACU  Anesthesia Type:General  Level of Consciousness: drowsy  Airway & Oxygen Therapy: Patient Spontanous Breathing and Patient connected to face mask oxygen  Post-op Assessment: Report given to RN, Post -op Vital signs reviewed and stable and Patient moving all extremities  Post vital signs: Reviewed and stable  Last Vitals:  Vitals Value Taken Time  BP 139/104 12/15/2018  7:17 PM  Temp    Pulse 114 12/15/2018  7:19 PM  Resp 6 12/15/2018  7:19 PM  SpO2 100 % 12/15/2018  7:19 PM  Vitals shown include unvalidated device data.  Last Pain:  Vitals:   12/15/18 1900  TempSrc:   PainSc: (P) Asleep      Patients Stated Pain Goal: 3 (12/15/18 1834)  Complications: No apparent anesthesia complications

## 2018-12-16 ENCOUNTER — Encounter (HOSPITAL_COMMUNITY): Payer: Self-pay | Admitting: Neurological Surgery

## 2018-12-16 ENCOUNTER — Other Ambulatory Visit: Payer: Self-pay

## 2018-12-16 LAB — BASIC METABOLIC PANEL
Anion gap: 8 (ref 5–15)
BUN: 22 mg/dL (ref 8–23)
CO2: 20 mmol/L — ABNORMAL LOW (ref 22–32)
Calcium: 7.2 mg/dL — ABNORMAL LOW (ref 8.9–10.3)
Chloride: 106 mmol/L (ref 98–111)
Creatinine, Ser: 2 mg/dL — ABNORMAL HIGH (ref 0.61–1.24)
GFR calc Af Amer: 40 mL/min — ABNORMAL LOW (ref >60)
GFR calc non Af Amer: 35 mL/min — ABNORMAL LOW (ref >60)
Glucose, Bld: 123 mg/dL — ABNORMAL HIGH (ref 70–99)
Potassium: 4.6 mmol/L (ref 3.5–5.1)
Sodium: 134 mmol/L — ABNORMAL LOW (ref 135–145)

## 2018-12-16 LAB — CBC
HCT: 28.6 % — ABNORMAL LOW (ref 39.0–52.0)
Hemoglobin: 9.2 g/dL — ABNORMAL LOW (ref 13.0–17.0)
MCH: 30.3 pg (ref 26.0–34.0)
MCHC: 32.2 g/dL (ref 30.0–36.0)
MCV: 94.1 fL (ref 80.0–100.0)
Platelets: 189 10*3/uL (ref 150–400)
RBC: 3.04 MIL/uL — ABNORMAL LOW (ref 4.22–5.81)
RDW: 13.4 % (ref 11.5–15.5)
WBC: 11.8 10*3/uL — ABNORMAL HIGH (ref 4.0–10.5)
nRBC: 0 % (ref 0.0–0.2)

## 2018-12-16 MED ORDER — VANCOMYCIN HCL IN DEXTROSE 750-5 MG/150ML-% IV SOLN
750.0000 mg | Freq: Two times a day (BID) | INTRAVENOUS | Status: DC
  Administered 2018-12-16 – 2018-12-19 (×6): 750 mg via INTRAVENOUS
  Filled 2018-12-16 (×6): qty 150

## 2018-12-16 MED ORDER — SODIUM CHLORIDE 0.9 % IV BOLUS
500.0000 mL | Freq: Once | INTRAVENOUS | Status: AC
  Administered 2018-12-16: 500 mL via INTRAVENOUS

## 2018-12-16 MED FILL — Thrombin For Soln 5000 Unit: CUTANEOUS | Qty: 5000 | Status: AC

## 2018-12-16 MED FILL — Gelatin Absorbable MT Powder: OROMUCOSAL | Qty: 1 | Status: AC

## 2018-12-16 NOTE — Progress Notes (Signed)
MD notified patient's BP running in the 80s/60s transferring from 4NICU. MD gave order for NS bolus and resume LR at 131ml/hr when patient arrives to the unit.

## 2018-12-16 NOTE — Evaluation (Signed)
Occupational Therapy Evaluation Patient Details Name: Tyler Christian MRN: [id number] DOB: 19-Aug-1956 Today's Date: 12/16/2018    History of Present Illness 63 yo admitted for L 3-S1 fusion. PMHx: charcot marie tooth neuropathy, scoliosis, HTN, HLD, sleep apnea, bil RCR   Clinical Impression   This 63 yo male admitted with above presents to acute OT with back precautions, chest pain more so than back pain, decreased mobility all affecting his safety and independence with basic ADLs. He will benefit from acute OT without need for follow up.    Follow Up Recommendations  No OT follow up;Supervision/Assistance - 24 hour    Equipment Recommendations  3 in 1 bedside commode       Precautions / Restrictions Precautions Precautions: Back;Fall Precaution Booklet Issued: Yes (comment) Precaution Comments: wears an ankle brace on LLE at times per pt Required Braces or Orthoses: Spinal Brace Spinal Brace: Lumbar corset;Applied in sitting position Restrictions Weight Bearing Restrictions: No      Mobility Bed Mobility Overal bed mobility: Needs Assistance Bed Mobility: Rolling;Sidelying to Sit Rolling: Supervision Sidelying to sit: Mod assist     General bed mobility comments: increased need for A for side lying to sit to sit due to CP from laying on table for 10 hour surgery  Transfers Overall transfer level: Needs assistance Equipment used: Rolling walker (2 wheeled) Transfers: Sit to/from Stand Sit to Stand: Min assist         General transfer comment: cuing for hand placement; pt ambulated 100 feet with RW with min A    Balance Overall balance assessment: Needs assistance Sitting-balance support: No upper extremity supported;Feet supported Sitting balance-Leahy Scale: Fair     Standing balance support: Bilateral upper extremity supported Standing balance-Leahy Scale: Poor                             ADL either performed or assessed with clinical judgement    ADL Overall ADL's : Needs assistance/impaired Eating/Feeding: Independent;Sitting   Grooming: Set up;Sitting   Upper Body Bathing: Set up;Sitting   Lower Body Bathing: Moderate assistance Lower Body Bathing Details (indicate cue type and reason): min A sit<>stand Upper Body Dressing : Set up;Sitting   Lower Body Dressing: Maximal assistance Lower Body Dressing Details (indicate cue type and reason): Min A sit<>stand Toilet Transfer: Ambulation;RW   Toileting- Clothing Manipulation and Hygiene: Moderate assistance Toileting - Clothing Manipulation Details (indicate cue type and reason): min A sit<>stand             Vision Patient Visual Report: No change from baseline              Pertinent Vitals/Pain Pain Assessment: 0-10 Pain Score: 6  Faces Pain Scale: Hurts even more Pain Location: chest Pain Descriptors / Indicators: Sore;Aching Pain Intervention(s): Limited activity within patient's tolerance;Monitored during session;Repositioned     Hand Dominance Right   Extremity/Trunk Assessment Upper Extremity Assessment Upper Extremity Assessment: Overall WFL for tasks assessed(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 weak)     Communication Communication Communication: No difficulties   Cognition Arousal/Alertness: Awake/alert Behavior During Therapy: WFL for tasks assessed/performed Overall Cognitive Status: Within Functional Limits for tasks assessed                                                Home  Living Family/patient expects to be discharged to:: Private residence Living Arrangements: Alone Available Help at Discharge: Family;Available 24 hours/day(sister) Type of Home: House Home Access: Stairs to enter [employer] of Steps: 1   Home Layout: One level     Bathroom Shower/Tub: Producer, television/film/video: Handicapped height     Home Equipment: Environmental consultant - 2 wheels;Cane - single point;Other  (comment);Adaptive equipment Adaptive Equipment: Sock aid        Prior Functioning/Environment Level of Independence: Independent                 OT Problem List: Decreased range of motion;Impaired balance (sitting and/or standing);Pain;Impaired UE functional use;Decreased knowledge of use of DME or AE;Decreased knowledge of precautions      OT Treatment/Interventions: Self-care/ADL training;Balance training;DME and/or AE instruction;Patient/family education    OT Goals(Current goals can be found in the care plan section) Acute Rehab OT Goals Patient Stated Goal: for chest to quit hurting OT Goal Formulation: With patient Time For Goal Achievement: 12/30/18 Potential to Achieve Goals: Good  OT Frequency: Min 2X/week              AM-PAC OT "6 Clicks" Daily Activity     Outcome Measure Help from another person eating meals?: None Help from another person taking care of personal grooming?: A Little Help from another person toileting, which includes using toliet, bedpan, or urinal?: A Lot Help from another person bathing (including washing, rinsing, drying)?: A Lot Help from another person to put on and taking off regular upper body clothing?: A Little Help from another person to put on and taking off regular lower body clothing?: A Lot 6 Click Score: 16   End of Session Equipment Utilized During Treatment: Gait belt;Rolling walker;Back brace  Activity Tolerance: Patient tolerated treatment well(Biggest issue is pain in chest) Patient left: in chair;with call bell/phone within reach  OT Visit Diagnosis: Unsteadiness on feet (R26.81);Other abnormalities of gait and mobility (R26.89);Muscle weakness (generalized) (M62.81);Pain Pain - part of body: (chest from laying for extensive period of time for surgery (10 hours))                Time: 1426-1510 OT Time Calculation (min): 44 min Charges:  OT General Charges $OT Visit: 1 Visit OT Evaluation $OT Eval Moderate  Complexity: 1 Mod OT Treatments $Self Care/Home Management : 23-37 mins  Ignacia Palma, OTR/L Acute [employer] Pager [phone] Office [phone]     Tyler Christian 12/16/2018, 3:19 PM

## 2018-12-16 NOTE — Evaluation (Signed)
Physical Therapy Evaluation Patient Details Name: Tyler Christian MRN: [id number] DOB: 1956/04/10 Today's Date: 12/16/2018   History of Present Illness  63 yo admitted for L 3-S1 fusion. PMHx: charcot marie tooth neuropathy, scoliosis, HTN, HLD, sleep apnea, bil RCR  Clinical Impression  Pt up in recliner upon arrival and agreed to participate with therapy. Pt presents with pain, decreased strength, and activity tolerance s/p lumbar fusion limiting his functional mobility and ability to return home safely. Pt educated on precautions, sitting tolerance,donning/doffing brace, performing daily activities within precautions and activity progression.  Pt would benefit from skilled acute therapy to address limitations in order to improve safety and independence.  Pt and his sister were educated on plan to follow acutely with physical therapy and both consented.     Follow Up Recommendations No PT follow up    Equipment Recommendations  None recommended by PT    Recommendations for Other Services OT consult     Precautions / Restrictions Precautions Precautions: Back;Fall Precaution Booklet Issued: Yes (comment) Required Braces or Orthoses: Spinal Brace Spinal Brace: Lumbar corset;Applied in sitting position(brace on upon arrival) Restrictions Weight Bearing Restrictions: No      Mobility  Bed Mobility Overal bed mobility: Needs Assistance Bed Mobility: Sit to Sidelying         Sit to sidelying: Min assist General bed mobility comments: min assist for assistance with LEs onto edge of bed, cuing for proper technique  Transfers Overall transfer level: Needs assistance Equipment used: Rolling walker (2 wheeled) Transfers: Sit to/from Stand Sit to Stand: Min guard         General transfer comment: cuing for hand placement  Ambulation/Gait Ambulation/Gait assistance: Min guard Gait Distance (Feet): 200 Feet Assistive device: Rolling walker (2 wheeled) Gait Pattern/deviations:  Step-to pattern;Decreased stride length;Trunk flexed;Antalgic Gait velocity: decreased   General Gait Details: pt ambulates with cautious antalgic gait with step to pattern with heavy reliance on UE on RW  Stairs            Wheelchair Mobility    Modified Rankin (Stroke Patients Only)       Balance Overall balance assessment: Mild deficits observed, not formally tested                                           Pertinent Vitals/Pain Pain Assessment: Faces Faces Pain Scale: Hurts even more Pain Location: chest, low back Pain Descriptors / Indicators: Sore;Aching Pain Intervention(s): Limited activity within patient's tolerance;Monitored during session;Repositioned    Home Living Family/patient expects to be discharged to:: Private residence Living Arrangements: Alone Available Help at Discharge: Family;Available 24 hours/day(sister) Type of Home: House Home Access: Stairs to enter   [employer] of Steps: 1 Home Layout: One level Home Equipment: Walker - 2 wheels;Cane - single point;Other (comment)(LLE aFO)      Prior Function Level of Independence: Independent               Hand Dominance        Extremity/Trunk Assessment   Upper Extremity Assessment Upper Extremity Assessment: Defer to OT evaluation    Lower Extremity Assessment Lower Extremity Assessment: Overall WFL for tasks assessed    Cervical / Trunk Assessment Cervical / Trunk Assessment: Kyphotic(post surgical)  Communication   Communication: No difficulties  Cognition Arousal/Alertness: Awake/alert Behavior During Therapy: WFL for tasks assessed/performed Overall Cognitive Status: Within Functional Limits for tasks  assessed                                        General Comments      Exercises     Assessment/Plan    PT Assessment Patient needs continued PT services  PT Problem List Decreased strength;Decreased activity  tolerance;Decreased mobility;Decreased knowledge of precautions;Decreased knowledge of use of DME;Decreased coordination;Decreased range of motion;Decreased balance       PT Treatment Interventions DME instruction;Stair training;Therapeutic activities;Balance training;Gait training;Functional mobility training;Therapeutic exercise;Patient/family education;Neuromuscular re-education    PT Goals (Current goals can be found in the Care Plan section)  Acute Rehab PT Goals Patient Stated Goal: return home PT Goal Formulation: With patient Time For Goal Achievement: 12/23/18 Potential to Achieve Goals: Good    Frequency Min 5X/week   Barriers to discharge Decreased caregiver support(pt lives alone normally, spending first 3 weeks at sisters house)      Co-evaluation               AM-PAC PT "6 Clicks" Mobility  Outcome Measure Help needed turning from your back to your side while in a flat bed without using bedrails?: A Little Help needed moving from lying on your back to sitting on the side of a flat bed without using bedrails?: A Little Help needed moving to and from a bed to a chair (including a wheelchair)?: A Little Help needed standing up from a chair using your arms (e.g., wheelchair or bedside chair)?: A Little Help needed to walk in hospital room?: A Little Help needed climbing 3-5 steps with a railing? : A Lot 6 Click Score: 17    End of Session Equipment Utilized During Treatment: Gait belt;Back brace Activity Tolerance: Patient tolerated treatment well Patient left: in bed;with call bell/phone within reach;with family/visitor present Nurse Communication: Mobility status PT Visit Diagnosis: Other symptoms and signs involving the nervous system (R29.898);Other abnormalities of gait and mobility (R26.89)    Time: 0737-1062 PT Time Calculation (min) (ACUTE ONLY): 20 min   Charges:   PT Evaluation $PT Eval Moderate Complexity: 1 Mod          Emili Mcloughlin,  [place] [phone]   Kincaid Tiger 12/16/2018, 1:39 PM

## 2018-12-16 NOTE — Progress Notes (Signed)
Patient ID: Tyler Christian, male   DOB: 1955-11-20, 63 y.o.   MRN: [id number] Vital signs are intact motor function is intact as it has been preoperatively.  Patient has not been mobilized yet labs are still pending We will mobilize this morning.  He does use CPAP at night.  Seems to be tolerating things fairly well.

## 2018-12-16 NOTE — Progress Notes (Signed)
OT Cancellation Note  Patient Details Name: Tyler Christian MRN: [id number] DOB: 1956-07-05   Cancelled Treatment:    Reason Eval/Treat Not Completed: Other (comment). Pt in middle of eating lunch, will re-attempt eval later.  Tyler Christian, OTR/L Acute Rehab Services Pager [phone] Office [phone]     Evette Georges 12/16/2018, 1:32 PM

## 2018-12-16 NOTE — Social Work (Signed)
CSW acknowledging consult for SNF placement. Will follow for therapy recommendations.   Breckan Cafiero, MSW, LCSWA [place] Clinical Social Work [phone]   

## 2018-12-16 NOTE — Progress Notes (Signed)
Patient has home CPAP and mask and is able to apply with no assistance.

## 2018-12-16 NOTE — Progress Notes (Signed)
Orthopedic Tech Progress Note Patient Details:  Tyler Christian 17-Jul-1956 [id number] BRACE was dropped off by [facility] yesterday morning  Patient ID: Tyler Christian, male   DOB: [birth year], 63 y.o.   MRN: [id number]   [name] 12/16/2018, 8:56 AM

## 2018-12-17 NOTE — Plan of Care (Signed)
  Problem: Education: Goal: Ability to verbalize activity precautions or restrictions will improve Outcome: Progressing Goal: Understanding of discharge needs will improve Outcome: Progressing   Problem: Activity: Goal: Ability to avoid complications of mobility impairment will improve Outcome: Progressing Goal: Ability to tolerate increased activity will improve Outcome: Progressing

## 2018-12-17 NOTE — Anesthesia Postprocedure Evaluation (Signed)
Anesthesia Post Note  Patient: Tyler Christian  Procedure(s) Performed: Lumbar three-four Lumbar four-five Lumbar five-Sacral one Posterior lumbar interbody fusion with percutaneous screws Lumbar three to Ilium with MAZOR (N/A Back) APPLICATION OF ROBOTIC ASSISTANCE FOR SPINAL PROCEDURE (N/A )     Patient location during evaluation: PACU Anesthesia Type: General Level of consciousness: awake and alert Pain management: pain level controlled Vital Signs Assessment: post-procedure vital signs reviewed and stable Respiratory status: spontaneous breathing, nonlabored ventilation, respiratory function stable and patient connected to nasal cannula oxygen Cardiovascular status: blood pressure returned to baseline and stable Postop Assessment: no apparent nausea or vomiting Anesthetic complications: no    Last Vitals:  Vitals:   12/17/18 1700 12/17/18 1800  BP:    Pulse:    Resp: 20 17  Temp:    SpO2:      Last Pain:  Vitals:   12/17/18 1700  TempSrc:   PainSc: 3                  Rikki Smestad

## 2018-12-17 NOTE — Progress Notes (Signed)
Occupational Therapy Treatment Patient Details Name: Tyler Christian MRN: [id number] DOB: 22-Oct-1955 Today's Date: 12/17/2018    History of present illness 63 yo admitted for L 3-S1 fusion. PMHx: charcot marie tooth neuropathy, scoliosis, HTN, HLD, sleep apnea, bil RCR   OT comments  This 63 yo male admitted and underwent above presents to acute OT with making progress with ADLs, but still struggling with bed mobility and transfers. He will continue to benefit from acute OT without need for follow up.   Follow Up Recommendations  No OT follow up;Supervision/Assistance - 24 hour    Equipment Recommendations  3 in 1 bedside commode       Precautions / Restrictions Precautions Precautions: Back;Fall Precaution Comments: wears an ankle brace on LLE at times per pt Required Braces or Orthoses: Spinal Brace Spinal Brace: Lumbar corset;Applied in sitting position Restrictions Weight Bearing Restrictions: No       Mobility Bed Mobility Overal bed mobility: Needs Assistance Bed Mobility: Rolling;Sidelying to Sit Rolling: Supervision(use or rail and cues for sequence) Sidelying to sit: Mod assist       General bed mobility comments: increased need for A for side-lying to sit due to CP from laying on table for 10 hour surgery and back pain today  Transfers Overall transfer level: Needs assistance Equipment used: Rolling walker (2 wheeled) Transfers: Sit to/from Stand Sit to Stand: Min assist         General transfer comment: cuing for hand placement; pt ambulated 150 feet with RW with min A; increased time for sit<>stand    Balance Overall balance assessment: Needs assistance Sitting-balance support: Feet supported;Bilateral upper extremity supported Sitting balance-Leahy Scale: Poor     Standing balance support: Bilateral upper extremity supported;During functional activity Standing balance-Leahy Scale: Poor                             ADL either performed  or assessed with clinical judgement   ADL Overall ADL's : (P) Needs assistance/impaired                         Toilet Transfer: Ambulation;RW   Toileting- Clothing Manipulation and Hygiene: Moderate assistance Toileting - Clothing Manipulation Details (indicate cue type and reason): min A sit<>stand       General ADL Comments: Educated pt on use of sock aid, reacher, and dressing stick for doffing and donning socks, underwear, and pants (he returned demonstrated); also educated on long shoe horn, long sponge, and elastic shoe laces. Total A to don brace sititng EOB --pt unable to free up hands at EOB (using them for support)     Vision Patient Visual Report: No change from baseline            Cognition Arousal/Alertness: Awake/alert Behavior During Therapy: WFL for tasks assessed/performed Overall Cognitive Status: Within Functional Limits for tasks assessed                                                     Pertinent Vitals/ Pain       Pain Assessment: 0-10 Pain Score: 6  Pain Location: chest and back ( more in back today) Pain Descriptors / Indicators: Sore;Aching Pain Intervention(s): Limited activity within patient's tolerance;Monitored during session;Premedicated before session;Repositioned  Frequency  Min 2X/week        Progress Toward Goals  OT Goals(current goals can now be found in the care plan section)  Progress towards OT goals: Progressing toward goals     Plan Discharge plan remains appropriate       AM-PAC OT "6 Clicks" Daily Activity     Outcome Measure   Help from another person eating meals?: None Help from another person taking care of personal grooming?: A Little Help from another person toileting, which includes using toliet, bedpan, or urinal?: A Lot Help from another person bathing (including washing, rinsing, drying)?: A Lot Help from another person to put on and taking off regular upper body  clothing?: A Little Help from another person to put on and taking off regular lower body clothing?: A Little 6 Click Score: 17    End of Session Equipment Utilized During Treatment: Gait belt;Rolling walker;Back brace  OT Visit Diagnosis: Unsteadiness on feet (R26.81);Other abnormalities of gait and mobility (R26.89);Muscle weakness (generalized) (M62.81);Pain Pain - part of body: (back and chest)   Activity Tolerance Patient tolerated treatment well   Patient Left in chair;with call bell/phone within reach   Nurse Communication Mobility status(pt will be calling in about 30 minutes to get up from chair (can either ambulate and then sit up for another 30 minutes or get back to bed)        Time: 1594-5859 OT Time Calculation (min): 48 min  Charges: OT General Charges $OT Visit: 1 Visit OT Treatments $Self Care/Home Management : 38-52 mins  Ignacia Palma, OTR/L Acute [employer] Pager [phone] Office [phone]     Evette Georges 12/17/2018, 10:17 AM

## 2018-12-17 NOTE — Progress Notes (Signed)
Postop day 2.  Patient still with quite a bit of lower back pain.  No radicular pain.  Mobility very limited secondary to pain.  Patient does not feel ready for discharge home.  Vital signs are stable.  He is afebrile.  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  Dressings clean and dry.  Abdomen soft.  Progressing slowly following lumbar decompression and fusion.  Continue efforts at mobilization.  Possible discharge home tomorrow.

## 2018-12-17 NOTE — Progress Notes (Signed)
Physical Therapy Treatment Patient Details Name: Tyler Christian MRN: [id number] DOB: 07-Aug-1956 Today's Date: 12/17/2018    History of Present Illness Pt is a 63 yo admitted for L 3-S1 fusion. PMHx: charcot marie tooth neuropathy, scoliosis, HTN, HLD, sleep apnea, bil RCR    PT Comments    Pt very limited secondary to increased pain today. He required more assistance with bed mobility and transfers this session as compared to yesterday. Of note, pt's HR increasing from low 100's to mid 120's with standing and short distance ambulation. Pt with no reports of dizziness throughout. Pt would continue to benefit from skilled physical therapy services at this time while admitted and after d/c to address the below listed limitations in order to improve overall safety and independence with functional mobility.    Follow Up Recommendations  Home health PT;Supervision/Assistance - 24 hour     Equipment Recommendations  None recommended by PT    Recommendations for Other Services       Precautions / Restrictions Precautions Precautions: Back;Fall Precaution Comments: reviewed 3/3 back precautions and log roll technique Required Braces or Orthoses: Spinal Brace Spinal Brace: Lumbar corset;Applied in sitting position Restrictions Weight Bearing Restrictions: No    Mobility  Bed Mobility Overal bed mobility: Needs Assistance Bed Mobility: Rolling;Sidelying to Sit Rolling: Min assist Sidelying to sit: Mod assist       General bed mobility comments: increased time and effort, heavy use of railing, cueing for log roll, assistance needed for bilateral LEs to move off of bed, assistance needed for trunk elevation  Transfers Overall transfer level: Needs assistance Equipment used: Rolling walker (2 wheeled) Transfers: Sit to/from Stand Sit to Stand: From elevated surface;Min assist;Mod assist         General transfer comment: cueing for safe hand placement, initially needing mod A with  first trial and progressing to min A with second  Ambulation/Gait Ambulation/Gait assistance: Min guard Gait Distance (Feet): 20 Feet Assistive device: Rolling walker (2 wheeled) Gait Pattern/deviations: Step-through pattern;Decreased step length - right;Decreased step length - left;Decreased stride length;Trunk flexed Gait velocity: decreased   General Gait Details: pt ambulating with cautious, guarded gait pattern; ambulating from bed to bathroom to attempt having a BM   Stairs             Wheelchair Mobility    Modified Rankin (Stroke Patients Only)       Balance Overall balance assessment: Needs assistance Sitting-balance support: Feet supported;Bilateral upper extremity supported Sitting balance-Leahy Scale: Poor     Standing balance support: Bilateral upper extremity supported;During functional activity Standing balance-Leahy Scale: Poor                              Cognition Arousal/Alertness: Awake/alert Behavior During Therapy: WFL for tasks assessed/performed Overall Cognitive Status: Within Functional Limits for tasks assessed                                        Exercises      General Comments        Pertinent Vitals/Pain Pain Assessment: 0-10 Pain Score: 8  Pain Location: back Pain Descriptors / Indicators: Sore;Aching Pain Intervention(s): Monitored during session;Repositioned    Home Living                      Prior Function  PT Goals (current goals can now be found in the care plan section) Acute Rehab PT Goals PT Goal Formulation: With patient Time For Goal Achievement: 12/23/18 Potential to Achieve Goals: Good Progress towards PT goals: Progressing toward goals    Frequency    Min 5X/week      PT Plan Current plan remains appropriate    Co-evaluation              AM-PAC PT "6 Clicks" Mobility   Outcome Measure  Help needed turning from your back to your side  while in a flat bed without using bedrails?: A Lot Help needed moving from lying on your back to sitting on the side of a flat bed without using bedrails?: A Lot Help needed moving to and from a bed to a chair (including a wheelchair)?: A Little Help needed standing up from a chair using your arms (e.g., wheelchair or bedside chair)?: A Lot Help needed to walk in hospital room?: A Little Help needed climbing 3-5 steps with a railing? : A Little 6 Click Score: 15    End of Session Equipment Utilized During Treatment: Gait belt;Back brace Activity Tolerance: Patient limited by pain Patient left: with call bell/phone within reach;Other (comment)(on toilet attempting to have a BM) Nurse Communication: Mobility status PT Visit Diagnosis: Other symptoms and signs involving the nervous system (R29.898);Other abnormalities of gait and mobility (R26.89)     Time: 0932-3557 PT Time Calculation (min) (ACUTE ONLY): 19 min  Charges:  $Therapeutic Activity: 8-22 mins                     Deborah Chalk, [place], DPT  Acute Rehabilitation Services Pager [phone] Office [phone]     Tyler Christian 12/17/2018, 12:54 PM

## 2018-12-18 LAB — CBC
HCT: 23.9 % — ABNORMAL LOW (ref 39.0–52.0)
Hemoglobin: 7.8 g/dL — ABNORMAL LOW (ref 13.0–17.0)
MCH: 30.6 pg (ref 26.0–34.0)
MCHC: 32.6 g/dL (ref 30.0–36.0)
MCV: 93.7 fL (ref 80.0–100.0)
Platelets: 218 10*3/uL (ref 150–400)
RBC: 2.55 MIL/uL — ABNORMAL LOW (ref 4.22–5.81)
RDW: 13.6 % (ref 11.5–15.5)
WBC: 9.9 10*3/uL (ref 4.0–10.5)
nRBC: 0 % (ref 0.0–0.2)

## 2018-12-18 LAB — BASIC METABOLIC PANEL
Anion gap: 10 (ref 5–15)
BUN: 14 mg/dL (ref 8–23)
CHLORIDE: 108 mmol/L (ref 98–111)
CO2: 23 mmol/L (ref 22–32)
Calcium: 8.2 mg/dL — ABNORMAL LOW (ref 8.9–10.3)
Creatinine, Ser: 0.99 mg/dL (ref 0.61–1.24)
GFR calc Af Amer: >60 mL/min (ref >60)
GFR calc non Af Amer: >60 mL/min (ref >60)
Glucose, Bld: 142 mg/dL — ABNORMAL HIGH (ref 70–99)
Potassium: 3.5 mmol/L (ref 3.5–5.1)
Sodium: 141 mmol/L (ref 135–145)

## 2018-12-18 MED ORDER — TAMSULOSIN HCL 0.4 MG PO CAPS
0.4000 mg | ORAL_CAPSULE | Freq: Every day | ORAL | Status: DC
  Administered 2018-12-18 – 2018-12-23 (×6): 0.4 mg via ORAL
  Filled 2018-12-18 (×6): qty 1

## 2018-12-18 NOTE — Progress Notes (Signed)
Pharmacy Antibiotic Note  Tyler Christian is a 63 y.o. male admitted on 12/15/2018 with spondylosis with stenosis. Now s/p surgery with drain in place.  Pharmacy has been consulted for vancomycin dosing.  Plan: Continue Vancomycin 750 mg IV Q12h Monitor clinical picture, renal function, vanc trough prn F/U drain removal, C&S, abx deescalation / LOT  Goal AUC 400-550. Expected AUC: 475.7 SCr used: 0.99    Height: 5\' 8"  (172.7 cm) Weight: 208 lb 15.9 oz (94.8 kg) IBW/kg (Calculated) : 68.4  Temp (24hrs), Avg:99.1 F (37.3 C), Min:98.7 F (37.1 C), Max:99.5 F (37.5 C)  Recent Labs  Lab 12/16/18 0804 12/18/18 0819  WBC 11.8* 9.9  CREATININE 2.00* 0.99    Estimated Creatinine Clearance: 86.4 mL/min (by C-G formula based on SCr of 0.99 mg/dL).    Allergies  Allergen Reactions  . Penicillins Shortness Of Breath and Swelling    Did it involve swelling of the face/tongue/throat, SOB, or low BP? Yes Did it involve sudden or severe rash/hives, skin peeling, or any reaction on the inside of your mouth or nose? No Did you need to seek medical attention at a hospital or doctor's office? No When did it last happen?40 years ago If all above answers are "NO", may proceed with cephalosporin use.    Marland Kitchen Hydrocodone Other (See Comments)    Makes pt feel spaced out  . Oxycodone Other (See Comments)    Makes pt feel spaced out    Thank you for allowing pharmacy to be a part of this patient's care.  Ewing Schlein, PharmD PGY1 Pharmacy Resident 12/18/2018    1:28 PM Please check AMION for all [facility] Pharmacy numbers

## 2018-12-18 NOTE — Progress Notes (Signed)
Physical Therapy Treatment Patient Details Name: Tyler Christian MRN: [id number] DOB: 21-Feb-1956 Today's Date: 12/18/2018    History of Present Illness Pt is a 63 yo admitted for L 3-S1 fusion. PMHx: charcot marie tooth neuropathy, scoliosis, HTN, HLD, sleep apnea, bil RCR    PT Comments    Pt progressing with post-op mobility. Was able to improve gait distance this session, however overall distance limited by need to have a bowel movement. Pt was educated on brace application/wearing schedule, precautions, and activity progression. Tolerance for functional activity continues to be limited. Will continue to follow and progress as able per POC.   Follow Up Recommendations  Home health PT;Supervision/Assistance - 24 hour     Equipment Recommendations  None recommended by PT    Recommendations for Other Services OT consult     Precautions / Restrictions Precautions Precautions: Back;Fall Precaution Booklet Issued: Yes (comment) Precaution Comments: reviewed 3/3 back precautions and log roll technique Required Braces or Orthoses: Spinal Brace Spinal Brace: Lumbar corset;Applied in sitting position Restrictions Weight Bearing Restrictions: No    Mobility  Bed Mobility               General bed mobility comments: Pt was received sitting EOB after returning from bathroom with RN.   Transfers Overall transfer level: Needs assistance Equipment used: Rolling walker (2 wheeled) Transfers: Sit to/from Stand Sit to Stand: From elevated surface;Min assist         General transfer comment: VC's for hand placement on seated surface for safety. Increased time to power-up to full stand and min assist for balance support and safety.   Ambulation/Gait Ambulation/Gait assistance: Min guard Gait Distance (Feet): 100 Feet Assistive device: Rolling walker (2 wheeled) Gait Pattern/deviations: Step-through pattern;Decreased step length - right;Decreased step length - left;Decreased  stride length;Trunk flexed Gait velocity: decreased Gait velocity interpretation: <1.8 ft/sec, indicate of risk for recurrent falls General Gait Details: Slow and guarded. Distance limited by urgent need to have a bowel movement.    Stairs             Wheelchair Mobility    Modified Rankin (Stroke Patients Only)       Balance Overall balance assessment: Needs assistance Sitting-balance support: Feet supported;Bilateral upper extremity supported Sitting balance-Leahy Scale: Poor     Standing balance support: Bilateral upper extremity supported;During functional activity Standing balance-Leahy Scale: Poor Standing balance comment: Reliant on UE support for safety.                             Cognition Arousal/Alertness: Awake/alert Behavior During Therapy: WFL for tasks assessed/performed Overall Cognitive Status: Within Functional Limits for tasks assessed                                        Exercises      General Comments        Pertinent Vitals/Pain Pain Assessment: Faces Faces Pain Scale: Hurts even more Pain Location: back Pain Descriptors / Indicators: Sore;Aching Pain Intervention(s): Monitored during session    Home Living                      Prior Function            PT Goals (current goals can now be found in the care plan section) Acute Rehab PT Goals Patient Stated Goal: "  Get my bowels and bladder under control." PT Goal Formulation: With patient Time For Goal Achievement: 12/23/18 Potential to Achieve Goals: Good Progress towards PT goals: Progressing toward goals    Frequency    Min 5X/week      PT Plan Current plan remains appropriate    Co-evaluation              AM-PAC PT "6 Clicks" Mobility   Outcome Measure  Help needed turning from your back to your side while in a flat bed without using bedrails?: A Lot Help needed moving from lying on your back to sitting on the side of  a flat bed without using bedrails?: A Lot Help needed moving to and from a bed to a chair (including a wheelchair)?: A Little Help needed standing up from a chair using your arms (e.g., wheelchair or bedside chair)?: A Lot Help needed to walk in hospital room?: A Little Help needed climbing 3-5 steps with a railing? : A Little 6 Click Score: 15    End of Session Equipment Utilized During Treatment: Gait belt;Back brace Activity Tolerance: Patient limited by pain Patient left: with call bell/phone within reach;Other (comment)(on toilet attempting to have a BM) Nurse Communication: Mobility status PT Visit Diagnosis: Other symptoms and signs involving the nervous system (R29.898);Other abnormalities of gait and mobility (R26.89)     Time: 7494-4967 PT Time Calculation (min) (ACUTE ONLY): 24 min  Charges:  $Gait Training: 23-37 mins                     Conni Slipper, PT, DPT Acute Rehabilitation Services Pager: [phone] Office: [phone]    Marylynn Pearson 12/18/2018, 10:14 AM

## 2018-12-18 NOTE — Progress Notes (Signed)
Occupational Therapy Treatment Patient Details Name: Tyler Christian MRN: [id number] DOB: Nov 01, 1955 Today's Date: 12/18/2018    History of present illness Pt is a 63 yo admitted for L 3-S1 fusion. PMHx: charcot marie tooth neuropathy, scoliosis, HTN, HLD, sleep apnea, bil RCR   OT comments  Pt is able to perform ADLs with min A after a considerable amount of problem solving and adaptations.  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  We were able to problem solve a solution for his socks, but the Rt shoe is still an issue as he is unable to don it - will continue with attempts at adapting his shoe.  Bed mobility is also a challenge despite his best efforts - currently he requires min - mod A.    He does fatigue with activity, and pain was 7/10.  HR consistently in high 120's during activity.   Follow Up Recommendations  Home health OT;Supervision/Assistance - 24 hour(initiallyl )    Equipment Recommendations  3 in 1 bedside commode    Recommendations for Other Services      Precautions / Restrictions Precautions Precautions: Back;Fall Precaution Booklet Issued: Yes (comment) Precaution Comments: reviewed 3/3 back precautions and log roll technique Required Braces or Orthoses: Spinal Brace Spinal Brace: Lumbar corset;Applied in sitting position       Mobility Bed Mobility Overal bed mobility: Needs Assistance Bed Mobility: Rolling;Sidelying to Sit;Sit to Sidelying Rolling: Min assist Sidelying to sit: Min assist     Sit to sidelying: Mod assist General bed mobility comments: Pt has significant difficulty insistuting log roll technique despite his best efforts and requires assist to lift LEs into bed   Transfers Overall transfer level: Needs assistance Equipment used: Rolling walker (2 wheeled) Transfers: Sit to/from [facility] Sit to Stand: Min guard Stand pivot transfers: Min guard        General transfer comment: verbal cues for hand placement     Balance Overall balance assessment: Needs assistance Sitting-balance support: Feet supported;Bilateral upper extremity supported Sitting balance-Leahy Scale: Fair     Standing balance support: Bilateral upper extremity supported;During functional activity Standing balance-Leahy Scale: Poor                             ADL either performed or assessed with clinical judgement   ADL Overall ADL's : Needs assistance/impaired             Lower Body Bathing: Minimal assistance;Sit to/from stand Lower Body Bathing Details (indicate cue type and reason): using LH sponge      Lower Body Dressing: Minimal assistance;Sit to/from stand Lower Body Dressing Details (indicate cue type and reason): Pt struggled with donning socks using sock aid due to fusion Lt ankle.  Problem solved through solutions.  He currently is unable to don Rt shoe due to fused foot - heel of shoe complete collapses despite attempts to use AE, and adaptive techniques  Toilet Transfer: Ambulation;RW;Minimal assistance             General ADL Comments: Pt has acquired a hip kit.  Practiced LB ADLs - significant difficulty noted to due fused ankle Lt foot.  He also struggles due to impaired coordination of bil. hands      Vision       Perception     Praxis      Cognition Arousal/Alertness: Awake/alert Behavior During  Therapy: WFL for tasks assessed/performed Overall Cognitive Status: Within Functional Limits for tasks assessed                                          Exercises     Shoulder Instructions       General Comments requires min A to don brace     Pertinent Vitals/ Pain       Faces Pain Scale: Hurts even more Pain Location: back Pain Descriptors / Indicators: Sore;Aching  Home Living                                          Prior Functioning/Environment               Frequency  Min 2X/week        Progress Toward Goals  OT Goals(current goals can now be found in the care plan section)  Progress towards OT goals: Progressing toward goals     Plan Discharge plan needs to be updated    Co-evaluation                 AM-PAC OT "6 Clicks" Daily Activity     Outcome Measure   Help from another person eating meals?: None Help from another person taking care of personal grooming?: A Little Help from another person toileting, which includes using toliet, bedpan, or urinal?: A Lot Help from another person bathing (including washing, rinsing, drying)?: A Little Help from another person to put on and taking off regular upper body clothing?: A Little Help from another person to put on and taking off regular lower body clothing?: A Little 6 Click Score: 18    End of Session Equipment Utilized During Treatment: Back brace;Rolling walker  OT Visit Diagnosis: Unsteadiness on feet (R26.81);Muscle weakness (generalized) (M62.81);Pain Pain - part of body: (back )   Activity Tolerance Patient limited by pain;Patient tolerated treatment well   Patient Left in bed;with call bell/phone within reach   Nurse Communication Mobility status;Patient requests pain meds        Time: 8372-9021 OT Time Calculation (min): 79 min  Charges: OT General Charges $OT Visit: 1 Visit OT Treatments $Self Care/Home Management : 68-82 mins  Lucille Passy, OTR/L [place] Pager [phone] Office [phone]    Lucille Passy M 12/18/2018, 6:40 PM

## 2018-12-18 NOTE — Progress Notes (Signed)
Neurosurgery Service Progress Note  Subjective: No acute events overnight. Having back soreness, preop b/l leg pain completely resolved post-op   Objective: Vitals:   12/17/18 1924 12/18/18 0013 12/18/18 0345 12/18/18 0800  BP: (!) 139/91 127/75 137/77   Pulse: (!) 103 90 94 91  Resp: 16 (!) 21 14   Temp: 99.5 F (37.5 C) 99.4 F (37.4 C) 98.8 F (37.1 C)   TempSrc: Oral Oral Oral   SpO2: 98% 98% 98% 95%  Weight:      Height:       Temp (24hrs), Avg:99.1 F (37.3 C), Min:98.7 F (37.1 C), Max:99.5 F (37.5 C)  CBC Latest Ref Rng & Units 12/16/2018 12/15/2018 12/08/2018  WBC 4.0 - 10.5 K/uL 11.8(H) - 7.0  Hemoglobin 13.0 - 17.0 g/dL 9.4(F) 10.9(L) 14.1  Hematocrit 39.0 - 52.0 % 28.6(L) 32.0(L) 43.6  Platelets 150 - 400 K/uL 189 - 306   BMP Latest Ref Rng & Units 12/18/2018 12/16/2018 12/15/2018  Glucose 70 - 99 mg/dL 276(D) 470(L) -  BUN 8 - 23 mg/dL 14 22 -  Creatinine 2.95 - 1.24 mg/dL 7.47 3.40(Z) -  Sodium 135 - 145 mmol/L 141 134(L) 136  Potassium 3.5 - 5.1 mmol/L 3.5 4.6 4.2  Chloride 98 - 111 mmol/L 108 106 -  CO2 22 - 32 mmol/L 23 20(L) -  Calcium 8.9 - 10.3 mg/dL 8.2(L) 7.2(L) -    Intake/Output Summary (Last 24 hours) at 12/18/2018 1019 Last data filed at 12/18/2018 0800 Gross per 24 hour  Intake 2487.65 ml  Output 1135 ml  Net 1352.65 ml    Current Facility-Administered Medications:  .  0.9 %  sodium chloride infusion, 250 mL, Intravenous, Continuous, Barnett Abu, MD, Stopped at 12/16/18 1154 .  acetaminophen (TYLENOL) tablet 650 mg, 650 mg, Oral, Q4H PRN, 650 mg at 12/17/18 2036 **OR** acetaminophen (TYLENOL) suppository 650 mg, 650 mg, Rectal, Q4H PRN, Barnett Abu, MD .  alum & mag hydroxide-simeth (MAALOX/MYLANTA) 200-200-20 MG/5ML suspension 30 mL, 30 mL, Oral, Q6H PRN, Barnett Abu, MD .  benazepril (LOTENSIN) tablet 10 mg, 10 mg, Oral, QPM, Barnett Abu, MD, 10 mg at 12/17/18 1744 .  bisacodyl (DULCOLAX) suppository 10 mg, 10 mg, Rectal, Daily PRN,  Barnett Abu, MD .  docusate sodium (COLACE) capsule 100 mg, 100 mg, Oral, BID, Barnett Abu, MD, 100 mg at 12/18/18 0904 .  lactated ringers infusion, , Intravenous, Continuous, Barnett Abu, MD, Stopped at 12/17/18 2239 .  menthol-cetylpyridinium (CEPACOL) lozenge 3 mg, 1 lozenge, Oral, PRN **OR** phenol (CHLORASEPTIC) mouth spray 1 spray, 1 spray, Mouth/Throat, PRN, Barnett Abu, MD .  methocarbamol (ROBAXIN) tablet 500 mg, 500 mg, Oral, Q6H PRN, 500 mg at 12/17/18 1744 **OR** methocarbamol (ROBAXIN) 500 mg in dextrose 5 % 50 mL IVPB, 500 mg, Intravenous, Q6H PRN, Barnett Abu, MD, Stopped at 12/16/18 0617 .  morphine 2 MG/ML injection 2 mg, 2 mg, Intravenous, Q2H PRN, Barnett Abu, MD, 2 mg at 12/18/18 0742 .  ondansetron (ZOFRAN) tablet 4 mg, 4 mg, Oral, Q6H PRN **OR** ondansetron (ZOFRAN) injection 4 mg, 4 mg, Intravenous, Q6H PRN, Elsner, Henry, MD .  polyethylene glycol (MIRALAX / GLYCOLAX) packet 17 g, 17 g, Oral, Daily PRN, Barnett Abu, MD, 17 g at 12/17/18 1630 .  senna (SENOKOT) tablet 8.6 mg, 1 tablet, Oral, BID, Barnett Abu, MD, 8.6 mg at 12/18/18 0904 .  simvastatin (ZOCOR) tablet 40 mg, 40 mg, Oral, Daily, Barnett Abu, MD, 40 mg at 12/18/18 0904 .  sodium chloride flush (NS) 0.9 %  injection 3 mL, 3 mL, Intravenous, Q12H, Barnett Abu, MD, 3 mL at 12/17/18 0900 .  sodium chloride flush (NS) 0.9 % injection 3 mL, 3 mL, Intravenous, PRN, Barnett Abu, MD .  vancomycin (VANCOCIN) IVPB 750 mg/150 ml premix, 750 mg, Intravenous, Q12H, Masters, Darl Householder, RPH, Last Rate: 150 mL/hr at 12/18/18 0902, 750 mg at 12/18/18 0902   Physical Exam: Walking with PT, has minimal function in b/l ankles due to CMT, strength c/w baseline  Assessment & Plan: 63 y.o. man s/p L3-S1 PSF, recovering well. -having some bladder fullness and difficulty emptying, may be worsening his pain, will start flomax while he's in-house to see if this helps -cont to work w/ PT, recommending home  PT -possible discharge tomorrow  KOVEN WOLFGRAMM  12/18/18 10:19 AM

## 2018-12-19 ENCOUNTER — Inpatient Hospital Stay (HOSPITAL_COMMUNITY): Payer: BLUE CROSS/BLUE SHIELD

## 2018-12-19 LAB — PREPARE RBC (CROSSMATCH)

## 2018-12-19 MED ORDER — SODIUM CHLORIDE 0.9% IV SOLUTION
Freq: Once | INTRAVENOUS | Status: AC
  Administered 2018-12-19: 18:00:00 via INTRAVENOUS

## 2018-12-19 NOTE — Progress Notes (Signed)
Pt has home machine and places self on/off as needed. Rt will monitor.

## 2018-12-19 NOTE — Progress Notes (Signed)
Physical Therapy Treatment Patient Details Name: Tyler Christian MRN: [id number] DOB: 1956-10-11 Today's Date: 12/19/2018    History of Present Illness Pt is a 63 yo admitted for L 3-S1 fusion. PMHx: charcot marie tooth neuropathy, scoliosis, HTN, HLD, sleep apnea, bil RCR    PT Comments    Pt in bed upon arrival and agreeable to therapy. Pt improved in all mobility tasks compared to over the weekend. Pt reported feeling lightheaded at end of ambulation. BP 115/75 in sitting, nursing notified of pt reports. Pt would continue to benefit from skilled therapy to improve mobility and progress gait, stair and balance training.    Follow Up Recommendations  Home health PT;Supervision/Assistance - 24 hour     Equipment Recommendations  None recommended by PT    Recommendations for Other Services OT consult     Precautions / Restrictions Precautions Precautions: Back;Fall Precaution Booklet Issued: Yes (comment) Precaution Comments: pt able to recall 3/3 precautions Required Braces or Orthoses: Spinal Brace Spinal Brace: Lumbar corset;Applied in sitting position Restrictions Weight Bearing Restrictions: No    Mobility  Bed Mobility Overal bed mobility: Needs Assistance Bed Mobility: Rolling;Sidelying to Sit Rolling: Min guard Sidelying to sit: Min assist       General bed mobility comments: pt requires heavy use of railing for rolling, pt requires heavy use of railing, increased time and effort with min assist to get trunk upright   Transfers Overall transfer level: Needs assistance Equipment used: Rolling walker (2 wheeled) Transfers: Sit to/from Stand Sit to Stand: Min guard         General transfer comment: verbal cues for hand placement   Ambulation/Gait Ambulation/Gait assistance: Min guard Gait Distance (Feet): 300 Feet Assistive device: Rolling walker (2 wheeled) Gait Pattern/deviations: Step-through pattern;Decreased stride length;Trunk flexed Gait velocity:  decreased   General Gait Details: slow and guarded gait, increased reliance of UE on RW, cuing for upright posture   Stairs             Wheelchair Mobility    Modified Rankin (Stroke Patients Only)       Balance Overall balance assessment: Needs assistance Sitting-balance support: Feet supported;Bilateral upper extremity supported Sitting balance-Leahy Scale: Fair     Standing balance support: During functional activity;Bilateral upper extremity supported Standing balance-Leahy Scale: Poor Standing balance comment: reliant on UE support, pt able to stand and hold urinal with one hand on RW                            Cognition Arousal/Alertness: Awake/alert Behavior During Therapy: [facility] for tasks assessed/performed Overall Cognitive Status: Within Functional Limits for tasks assessed                                        Exercises      General Comments        Pertinent Vitals/Pain Pain Score: 6  Pain Location: back Pain Descriptors / Indicators: Sore;Aching Pain Intervention(s): Limited activity within patient's tolerance;Monitored during session    Home Living                      Prior Function            PT Goals (current goals can now be found in the care plan section) Progress towards PT goals: Progressing toward goals    Frequency  Min 5X/week      PT Plan Current plan remains appropriate    Co-evaluation              AM-PAC PT "6 Clicks" Mobility   Outcome Measure  Help needed turning from your back to your side while in a flat bed without using bedrails?: A Lot Help needed moving from lying on your back to sitting on the side of a flat bed without using bedrails?: A Lot Help needed moving to and from a bed to a chair (including a wheelchair)?: A Little Help needed standing up from a chair using your arms (e.g., wheelchair or bedside chair)?: A Little Help needed to walk in hospital room?:  A Little Help needed climbing 3-5 steps with a railing? : A Little 6 Click Score: 16    End of Session Equipment Utilized During Treatment: Gait belt;Back brace Activity Tolerance: Patient tolerated treatment well Patient left: in chair;with call bell/phone within reach Nurse Communication: Mobility status PT Visit Diagnosis: Other symptoms and signs involving the nervous system (R29.898);Other abnormalities of gait and mobility (R26.89)     Time: 0722-0748 PT Time Calculation (min) (ACUTE ONLY): 26 min  Charges:  $Gait Training: 8-22 mins $Therapeutic Activity: 8-22 mins                     Domanik [place], [place] [phone]    Laurice Kimmons 12/19/2018, 9:13 AM

## 2018-12-19 NOTE — Progress Notes (Signed)
Patient ID: Tyler Christian, male   DOB: Feb 13, 1956, 63 y.o.   MRN: [id number] Vital signs stable while at rest but with minimal exertion patient becomes tachycardic and tachypnic. BP maintained bur energy level low. Post op hemoglobin now down to 23 secondary to ABLA. Discussed with patient , drain removed and two units prbcs transfusion for today.    Will ask rehab to evaluate for CIR Drain Dced.

## 2018-12-19 NOTE — Progress Notes (Signed)
Verbal order by Dr. Danielle Dess to remove Hemovac drain.  Hemovac drain removed, Site is clean, dry and intact.  Gauze and pressure dressing placed over site.  RN will continue to monitor.

## 2018-12-20 LAB — BPAM RBC
Blood Product Expiration Date: 202004032359
Blood Product Expiration Date: 202004032359
ISSUE DATE / TIME: 202003091713
ISSUE DATE / TIME: 202003092108
UNIT TYPE AND RH: 8400
Unit Type and Rh: 8400

## 2018-12-20 LAB — TYPE AND SCREEN
ABO/RH(D): AB POS
Antibody Screen: NEGATIVE
Unit division: 0
Unit division: 0

## 2018-12-20 MED ORDER — ALUM & MAG HYDROXIDE-SIMETH 200-200-20 MG/5ML PO SUSP: 30.0000 mL | ORAL | Status: DC | PRN

## 2018-12-20 NOTE — Progress Notes (Signed)
Occupational Therapy Progress Note (late entry)  Attempted to use shoe funnel in attempts at donning Rt shoe without success - Fusion of Rt ankle is making donning shoes very difficult.  Pt reports his sister can assist.   Recommend HHOT.    12/19/18 1500  OT Visit Information  Last OT Received On 12/20/18  Assistance Needed +1  History of Present Illness Pt is a 63 yo admitted for L 3-S1 fusion. PMHx: charcot marie tooth neuropathy, scoliosis, HTN, HLD, sleep apnea, bil RCR  Precautions  Precautions Back;Fall  Precaution Booklet Issued Yes (comment)  Precaution Comments pt able to recall 3/3 precautions  Required Braces or Orthoses Spinal Brace  Spinal Brace Lumbar corset;Applied in sitting position  Pain Assessment  Pain Assessment 0-10  Faces Pain Scale 8  Pain Location back  Pain Descriptors / Indicators Sore;Aching  Pain Intervention(s) Monitored during session;Repositioned  Cognition  Arousal/Alertness Awake/alert  Behavior During Therapy WFL for tasks assessed/performed  Overall Cognitive Status Within Functional Limits for tasks assessed  Upper Extremity Assessment  Upper Extremity Assessment Generalized weakness  RUE Coordination decreased fine motor  LUE Coordination decreased fine motor  Lower Extremity Assessment  Lower Extremity Assessment Defer to PT evaluation  ADL  Overall ADL's  Needs assistance/impaired  Lower Body Dressing Minimal assistance;Sit to/from stand  Lower Body Dressing Details (indicate cue type and reason) attempted to use shoe funnel to don Rt shoe without success.  Pt reports his sister can assist him with shoes, and HHOT can continue to work with pt on potential adaptations at home   Intel guard;Ambulation;Comfort height toilet;BSC;Grab bars;RW  Engineer, building services Min guard;Sit to/from stand  Functional mobility during ADLs Min guard;Rolling walker  Bed Mobility  Overal bed mobility Needs Assistance   Bed Mobility Rolling;Sidelying to Sit  Rolling Min guard  Sidelying to sit Min guard  General bed mobility comments increased time and effort   Balance  Overall balance assessment Needs assistance  Sitting-balance support Feet supported;Bilateral upper extremity supported  Sitting balance-Leahy Scale Fair  Standing balance support During functional activity;Bilateral upper extremity supported  Standing balance-Leahy Scale Poor  Standing balance comment reliant on UE support, pt able to stand and hold urinal with one hand on RW  Transfers  Overall transfer level Needs assistance  Equipment used Rolling walker (2 wheeled)  Transfers Sit to/from Stand  Sit to Stand Min guard  Stand pivot transfers Min guard  OT - End of Session  Equipment Utilized During Treatment Gait belt;Rolling walker;Back brace  Activity Tolerance Patient tolerated treatment well  Patient left with call bell/phone within reach;Other (comment) (on commoded - RN aware)  Nurse Communication Mobility status  OT Assessment/Plan  OT Plan Discharge plan remains appropriate  OT Visit Diagnosis Unsteadiness on feet (R26.81)  Pain - part of body  (back)  OT Frequency (ACUTE ONLY) Min 2X/week  Follow Up Recommendations Home health OT;Supervision/Assistance - 24 hour  OT Equipment 3 in 1 bedside commode  AM-PAC OT "6 Clicks" Daily Activity Outcome Measure (Version 2)  Help from another person eating meals? 4  Help from another person taking care of personal grooming? 3  Help from another person toileting, which includes using toliet, bedpan, or urinal? 3  Help from another person bathing (including washing, rinsing, drying)? 3  Help from another person to put on and taking off regular upper body clothing? 3  Help from another person to put on and taking off regular lower body clothing? 3  6  Click Score 19  OT Goal Progression  Progress towards OT goals Progressing toward goals  OT Time Calculation  OT Start Time  (ACUTE ONLY) 1349  OT Stop Time (ACUTE ONLY) 1403  OT Time Calculation (min) 14 min  OT General Charges  $OT Visit 1 Visit  OT Treatments  $Self Care/Home Management  8-22 mins  Jeani Hawking, OTR/L Acute Rehabilitation Services Pager [phone] Office [phone]

## 2018-12-20 NOTE — Progress Notes (Signed)
Physical Therapy Treatment Patient Details Name: Tyler Christian MRN: [id number] DOB: 12-30-1955 Today's Date: 12/20/2018    History of Present Illness Pt is a 63 yo admitted for L 3-S1 fusion. PMHx: charcot marie tooth neuropathy, scoliosis, HTN, HLD, sleep apnea, bil RCR    PT Comments    Pt in bed upon arrival and agreed to participate with therapy. Pt is progressing well towards PT goals but continues to rely heavily on bed rails to sit EOB. Pt progressed in ambulation distance and tolerance today. Pt reports still having difficulty with bowel and bladder control and pain. Pt would continue to benefit from skilled therapy to progress functional mobility and increase independence.   Follow Up Recommendations  Home health PT;Supervision/Assistance - 24 hour     Equipment Recommendations  None recommended by PT    Recommendations for Other Services       Precautions / Restrictions Precautions Precautions: Back;Fall Precaution Comments: pt able to recall 3/3 precautions Required Braces or Orthoses: Spinal Brace Spinal Brace: Lumbar corset;Applied in sitting position Restrictions Weight Bearing Restrictions: No    Mobility  Bed Mobility Overal bed mobility: Needs Assistance Bed Mobility: Rolling;Sidelying to Sit Rolling: Min guard Sidelying to sit: Min guard       General bed mobility comments: increased time and effort for sidelying to sit, pt relies heavily on use of rails to get torso upright  Transfers Overall transfer level: Needs assistance Equipment used: Rolling walker (2 wheeled) Transfers: Sit to/from Stand Sit to Stand: Min guard         General transfer comment: verbal cues for hand placement, increased time, min guard for safety as pt stands with increased trunk flexion  Ambulation/Gait Ambulation/Gait assistance: Min guard Gait Distance (Feet): 500 Feet Assistive device: Rolling walker (2 wheeled) Gait Pattern/deviations: Step-through  pattern;Decreased stride length;Trunk flexed Gait velocity: decreased   General Gait Details: slow and guarded gait however improves with distance, pt continue rely on UE support on RW, pt requires min cuing for upright posture as he now is beginning to correct himself without cuing    Stairs             Wheelchair Mobility    Modified Rankin (Stroke Patients Only)       Balance Overall balance assessment: Needs assistance Sitting-balance support: Feet supported;Bilateral upper extremity supported Sitting balance-Leahy Scale: Fair     Standing balance support: During functional activity;Bilateral upper extremity supported Standing balance-Leahy Scale: Poor Standing balance comment: reliant on UE support                            Cognition Arousal/Alertness: Awake/alert Behavior During Therapy: WFL for tasks assessed/performed Overall Cognitive Status: Within Functional Limits for tasks assessed                                        Exercises      General Comments        Pertinent Vitals/Pain Faces Pain Scale: Hurts even more Pain Location: back, chest  Pain Descriptors / Indicators: Sore;Aching Pain Intervention(s): Limited activity within patient's tolerance;Monitored during session    Home Living                      Prior Function            PT Goals (current goals can  now be found in the care plan section) Progress towards PT goals: Progressing toward goals    Frequency    Min 5X/week      PT Plan Current plan remains appropriate    Co-evaluation              AM-PAC PT "6 Clicks" Mobility   Outcome Measure  Help needed turning from your back to your side while in a flat bed without using bedrails?: A Lot Help needed moving from lying on your back to sitting on the side of a flat bed without using bedrails?: A Lot Help needed moving to and from a bed to a chair (including a wheelchair)?: A  Little Help needed standing up from a chair using your arms (e.g., wheelchair or bedside chair)?: A Little Help needed to walk in hospital room?: A Little Help needed climbing 3-5 steps with a railing? : A Lot 6 Click Score: 15    End of Session Equipment Utilized During Treatment: Gait belt;Back brace Activity Tolerance: Patient tolerated treatment well Patient left: in chair;with call bell/phone within reach Nurse Communication: Mobility status PT Visit Diagnosis: Other symptoms and signs involving the nervous system (R29.898);Other abnormalities of gait and mobility (R26.89)     Time: 4859-2763 PT Time Calculation (min) (ACUTE ONLY): 17 min  Charges:  $Gait Training: 8-22 mins                     Kalvyn Desa [place], [place] [phone]    Mineola Duan 12/20/2018, 8:24 AM

## 2018-12-20 NOTE — Progress Notes (Signed)
Physical medicine rehabilitation consult requested in chart reviewed. Patient currently ambulating 300 feet minimal guard. Recommendations by both physical and occupational therapy are for home health therapies. Patient will not need inpatient rehabilitation services.

## 2018-12-20 NOTE — Progress Notes (Signed)
Inpatient Rehabilitation-Admissions Coordinator   Consult received and appreciated. After chart review, noted pt is currently Min G for ambulation and Min G/Min A for most ADLs. Both PT and OT are recommending HH therapy at [place]. With current recommendations and functional status, it is highly unlikely pt's insurance would approve CIR request. For this reason, AC will sign off. AC will communicate with CM need for new dispo plans.   Please call if questions.   Nanine Means, OTR/L  Rehab Admissions Coordinator  [phone] 12/20/2018 8:50 AM

## 2018-12-20 NOTE — Progress Notes (Signed)
Patient ID: Tyler Christian, male   DOB: [birth year], 63 y.o.   MRN: [id number] Vital signs are stable Patient received 2 units of packed cells yesterday He is making improvement in his mobility X-rays demonstrate good alignment but significant distended large bowel We will plan on continuing to mobilize him and allow him to shower Consider discharge home with home health PT and OT Stable otherwise

## 2018-12-21 MED ORDER — MAGNESIUM HYDROXIDE 400 MG/5ML PO SUSP
30.0000 mL | Freq: Once | ORAL | Status: AC
  Administered 2018-12-21: 30 mL via ORAL

## 2018-12-21 NOTE — Progress Notes (Signed)
Patient ID: Tyler Christian, male   DOB: 1956/07/19, 63 y.o.   MRN: [id number] Vital signs are stable Patient is still voiding in small quantities New bladder scan has not been performed however I have requested this If the patient's bladder is retaining substantial urine greater than 400 cc and he should have a Foley catheter placed to decompress his bladder.  He is not passing much gas has small bowel movement today I have advised that we give him some milk of magnesia.

## 2018-12-21 NOTE — Progress Notes (Signed)
Patient urinated with theapies, states "it was only a little bit again". Complaining of stomach pain. Patient stomach is taut and distended, bladder scan = 585.   Foley inserted.

## 2018-12-21 NOTE — Progress Notes (Signed)
Physical Therapy Treatment Patient Details Name: Tyler Christian MRN: [id number] DOB: 03-06-1956 Today's Date: 12/21/2018    History of Present Illness Pt is a 63 yo admitted for L 3-S1 fusion. PMHx: charcot marie tooth neuropathy, scoliosis, HTN, HLD, sleep apnea, bil RCR    PT Comments    Pt presented in bed and agreed to engage in gait training and transfer training. Upon closer inspection the pt's newly inserted foley cath had come out. Nursing notified. Pt requires min guard to min assist with all mobility, transfers, and gait. Pt continues to be very reliant on BUE for support during gait activity. Pt educated on use of RW to negotiate stair at entry to home. Pt tolerates gait activity well and did not report an increase in pain with activity. Pt able to recall precautions and apply back brace independently. Plan to progress pt with higher level balance and therex activities to promote functional mobility and increase independence as pt pain level allows progressive therapy. Pt d/c plan to HHPT is appropriate at this time based on current progress.   Follow Up Recommendations  Home health PT;Supervision/Assistance - 24 hour     Equipment Recommendations  None recommended by PT    Recommendations for Other Services OT consult     Precautions / Restrictions Precautions Precautions: Back;Fall Precaution Booklet Issued: Yes (comment) Precaution Comments: pt able to recall 3/3 precautions Required Braces or Orthoses: Spinal Brace Spinal Brace: Lumbar corset;Applied in sitting position Restrictions Weight Bearing Restrictions: No    Mobility  Bed Mobility Overal bed mobility: Needs Assistance Bed Mobility: Rolling;Sidelying to Sit Rolling: Min guard Sidelying to sit: Min guard     Sit to sidelying: Min assist General bed mobility comments: VCs for hand placement and increased time to push up from side lying to sit  Transfers Overall transfer level: Needs  assistance Equipment used: Rolling walker (2 wheeled) Transfers: Sit to/from Stand Sit to Stand: Min guard         General transfer comment: verbal cues for hand placement, increased time, min guard for safety as pt stands with increased trunk flexion  Ambulation/Gait Ambulation/Gait assistance: Min guard Gait Distance (Feet): 300 Feet Assistive device: Rolling walker (2 wheeled) Gait Pattern/deviations: Step-through pattern;Decreased stride length;Trunk flexed Gait velocity: decreased   General Gait Details: slow and guarded gait however improves with distance, pt continue rely on UE support on RW, pt requires min cuing for upright posture as he now is beginning to correct himself without cuing, VCs for bigger steps, pt has limited BiL ankle ROM d/t previous full and partial ankle fusions.   Stairs             Wheelchair Mobility    Modified Rankin (Stroke Patients Only)       Balance Overall balance assessment: Needs assistance Sitting-balance support: Feet supported;Bilateral upper extremity supported Sitting balance-Leahy Scale: Fair     Standing balance support: During functional activity;Bilateral upper extremity supported Standing balance-Leahy Scale: Poor Standing balance comment: reliant on UE support                            Cognition Arousal/Alertness: Awake/alert Behavior During Therapy: WFL for tasks assessed/performed Overall Cognitive Status: Within Functional Limits for tasks assessed  Exercises      General Comments        Pertinent Vitals/Pain Pain Assessment: 0-10 Pain Score: 7  Pain Location: back Pain Descriptors / Indicators: Sore;Aching Pain Intervention(s): Limited activity within patient's tolerance;Monitored during session;Repositioned    Home Living                      Prior Function            PT Goals (current goals can now be found in the  care plan section) Acute Rehab PT Goals Patient Stated Goal: to get up into chair for a bit PT Goal Formulation: With patient Time For Goal Achievement: 12/23/18 Potential to Achieve Goals: Good    Frequency    Min 5X/week      PT Plan Current plan remains appropriate    Co-evaluation              AM-PAC PT "6 Clicks" Mobility   Outcome Measure  Help needed turning from your back to your side while in a flat bed without using bedrails?: A Little Help needed moving from lying on your back to sitting on the side of a flat bed without using bedrails?: A Lot Help needed moving to and from a bed to a chair (including a wheelchair)?: A Little Help needed standing up from a chair using your arms (e.g., wheelchair or bedside chair)?: A Little Help needed to walk in hospital room?: A Little Help needed climbing 3-5 steps with a railing? : A Lot 6 Click Score: 16    End of Session Equipment Utilized During Treatment: Gait belt;Back brace Activity Tolerance: Patient tolerated treatment well Patient left: in chair;with call bell/phone within reach Nurse Communication: Mobility status PT Visit Diagnosis: Other symptoms and signs involving the nervous system (R29.898);Other abnormalities of gait and mobility (R26.89)     Time: 7510-2585 PT Time Calculation (min) (ACUTE ONLY): 23 min  Charges:  $Gait Training: 23-37 mins                     Margarita Mail, SPTA   Margarita Mail 12/21/2018, 1:08 PM

## 2018-12-22 NOTE — Progress Notes (Signed)
Physical Therapy Treatment Patient Details Name: Tyler Christian MRN: [id number] DOB: 29-Oct-1955 Today's Date: 12/22/2018    History of Present Illness Pt is a 63 yo admitted for L 3-S1 fusion. PMHx: charcot marie tooth neuropathy, scoliosis, HTN, HLD, sleep apnea, bil RCR    PT Comments    Pt up in recliner upon arrival and agreed to participate with therapy. Pt is progressing towards physical therapy goals. Pt educated on performing car transfer within precautions and activity progression with pt verbalizing understanding.  Pt would continue to benefit from skilled therapy to keep progressing gait and balance training and improve independence and activity tolerance.   Follow Up Recommendations  Home health PT;Supervision/Assistance - 24 hour     Equipment Recommendations  None recommended by PT    Recommendations for Other Services       Precautions / Restrictions Precautions Precautions: Back;Fall Precaution Booklet Issued: Yes (comment) Precaution Comments: pt able to recall 3/3 precautions Required Braces or Orthoses: Spinal Brace Spinal Brace: Lumbar corset;Applied in sitting position Restrictions Weight Bearing Restrictions: No    Mobility  Bed Mobility               General bed mobility comments: pt up in chair upon arrival   Transfers Overall transfer level: Needs assistance Equipment used: Rolling walker (2 wheeled) Transfers: Sit to/from Stand Sit to Stand: Min guard         General transfer comment: verbal cues for hand placement, increased time, min guard for safety as pt stands with increased trunk flexion  Ambulation/Gait Ambulation/Gait assistance: Min guard Gait Distance (Feet): 300 Feet Assistive device: Rolling walker (2 wheeled) Gait Pattern/deviations: Step-through pattern;Decreased stride length;Trunk flexed Gait velocity: decreased   General Gait Details: pt ambulates with guarded gait pattern with continued reliance on UE support  from RW, pt required min cuing for upright posture but he is self correcting more frequently   Stairs             Wheelchair Mobility    Modified Rankin (Stroke Patients Only)       Balance Overall balance assessment: Needs assistance Sitting-balance support: Feet supported;Bilateral upper extremity supported Sitting balance-Leahy Scale: Fair Sitting balance - Comments: pt able to don brace independently    Standing balance support: During functional activity;Bilateral upper extremity supported Standing balance-Leahy Scale: Poor Standing balance comment: reliant on UE support                            Cognition Arousal/Alertness: Awake/alert Behavior During Therapy: WFL for tasks assessed/performed Overall Cognitive Status: Within Functional Limits for tasks assessed                                        Exercises Total Joint Exercises Long Arc Quad: AROM;Strengthening;Both;10 reps    General Comments        Pertinent Vitals/Pain Faces Pain Scale: Hurts even more Pain Location: back Pain Descriptors / Indicators: Sore;Aching Pain Intervention(s): Limited activity within patient's tolerance;Monitored during session;Premedicated before session;Repositioned    Home Living                      Prior Function            PT Goals (current goals can now be found in the care plan section) Acute Rehab PT Goals Patient Stated Goal: return  home PT Goal Formulation: With patient Time For Goal Achievement: 12/23/18 Potential to Achieve Goals: Good Progress towards PT goals: Progressing toward goals    Frequency    Min 5X/week      PT Plan Current plan remains appropriate    Co-evaluation              AM-PAC PT "6 Clicks" Mobility   Outcome Measure  Help needed turning from your back to your side while in a flat bed without using bedrails?: A Little Help needed moving from lying on your back to sitting on  the side of a flat bed without using bedrails?: A Lot Help needed moving to and from a bed to a chair (including a wheelchair)?: A Little Help needed standing up from a chair using your arms (e.g., wheelchair or bedside chair)?: A Little Help needed to walk in hospital room?: A Little Help needed climbing 3-5 steps with a railing? : A Lot 6 Click Score: 16    End of Session Equipment Utilized During Treatment: Gait belt;Back brace Activity Tolerance: Patient tolerated treatment well Patient left: in chair;with call bell/phone within reach Nurse Communication: Mobility status PT Visit Diagnosis: Other symptoms and signs involving the nervous system (R29.898);Other abnormalities of gait and mobility (R26.89)     Time: 1155-2080 PT Time Calculation (min) (ACUTE ONLY): 15 min  Charges:  $Gait Training: 8-22 mins                     Tyler Christian, [place] [phone]    Tyler Christian 12/22/2018, 10:21 AM

## 2018-12-22 NOTE — Progress Notes (Signed)
Subjective: Patient reports foley catheter placed  Objective: Vital signs in last 24 hours: Temp:  [97.6 F (36.4 C)-99 F (37.2 C)] 97.6 F (36.4 C) (03/12 0852) Pulse Rate:  [75-91] 91 (03/12 0852) BP: (123-147)/(74-112) 147/85 (03/12 0852) SpO2:  [95 %-100 %] 97 % (03/12 0852)  Intake/Output from previous day: 03/11 0701 - 03/12 0700 In: 480 [P.O.:480] Out: 1900 [Urine:1900] Intake/Output this shift: No intake/output data recorded.  incision clean and dry,abdomen softer  Lab Results: No results for input(s): WBC, HGB, HCT, PLT in the last 72 hours. BMET No results for input(s): NA, K, CL, CO2, GLUCOSE, BUN, CREATININE, CALCIUM in the last 72 hours.  Studies/Results: No results found.  Assessment/Plan: Foley in place  LOS: 7 days  allow bladder to decompress today and discontinue in am, plan for discharge tomorrow.Marland Kitchen   Shary Key Tatsuya Okray 12/22/2018, 9:02 AM

## 2018-12-23 MED ORDER — TRAMADOL HCL 50 MG PO TABS: 50.0000 mg | ORAL_TABLET | Freq: Four times a day (QID) | ORAL | 3 refills | Status: AC | PRN

## 2018-12-23 MED ORDER — CELECOXIB 200 MG PO CAPS
200.0000 mg | ORAL_CAPSULE | Freq: Every day | ORAL | Status: DC
  Administered 2018-12-23: 200 mg via ORAL
  Filled 2018-12-23: qty 1

## 2018-12-23 MED ORDER — TRAMADOL HCL 50 MG PO TABS
50.0000 mg | ORAL_TABLET | Freq: Four times a day (QID) | ORAL | Status: DC | PRN
  Administered 2018-12-23: 100 mg via ORAL
  Filled 2018-12-23: qty 2

## 2018-12-23 MED ORDER — CELECOXIB 200 MG PO CAPS: 200.0000 mg | ORAL_CAPSULE | Freq: Two times a day (BID) | ORAL | 3 refills | Status: AC

## 2018-12-23 MED ORDER — TAMSULOSIN HCL 0.4 MG PO CAPS: 0.4000 mg | ORAL_CAPSULE | Freq: Every day | ORAL | 2 refills | Status: AC

## 2018-12-23 NOTE — Progress Notes (Signed)
Occupational Therapy Treatment and Discharge Patient Details Name: JORDELL GIBBINS MRN: [id number] DOB: December 08, 1955 Today's Date: 12/23/2018    History of present illness Pt is a 63 yo admitted for L 3-S1 fusion. PMHx: charcot marie tooth neuropathy, scoliosis, HTN, HLD, sleep apnea, bil RCR   OT comments  This 63 yo male admitted and underwent above presents to acute with all acute care education completed. We will D/C from acute OT with HHOT recommended.  Follow Up Recommendations  Home health OT;Supervision/Assistance - 24 hour    Equipment Recommendations  3 in 1 bedside commode       Precautions / Restrictions Precautions Precautions: Back;Fall Precaution Booklet Issued: Yes (comment) Precaution Comments: pt able to recall 3/3 precautions Required Braces or Orthoses: Spinal Brace Spinal Brace: Lumbar corset;Applied in sitting position Restrictions Weight Bearing Restrictions: No       Mobility Bed Mobility               General bed mobility comments: Pt up in recliner upon arrival  Transfers Overall transfer level: Needs assistance Equipment used: Rolling walker (2 wheeled) Transfers: Sit to/from Stand Sit to Stand: Supervision         General transfer comment: Pt ambulated 200 feet with RW at S level    Balance Overall balance assessment: Needs assistance Sitting-balance support: Feet supported;Bilateral upper extremity supported Sitting balance-Leahy Scale: Good Sitting balance - Comments: pt able to don brace independently    Standing balance support: No upper extremity supported;During functional activity Standing balance-Leahy Scale: Fair Standing balance comment: reliant on UE support                           ADL either performed or assessed with clinical judgement   ADL                                         General ADL Comments: Pt able to get his right sock on with wide sock aid this session and his right shoe  on with shoe funnel this session. Pt also educated on toilet aid which works well for him.     Vision Patient Visual Report: No change from baseline            Cognition Arousal/Alertness: Awake/alert Behavior During Therapy: WFL for tasks assessed/performed Overall Cognitive Status: Within Functional Limits for tasks assessed                                                     Pertinent Vitals/ Pain       Pain Assessment: 0-10 Pain Score: 6  Faces Pain Scale: Hurts a little bit Pain Location: back Pain Descriptors / Indicators: Sore Pain Intervention(s): Premedicated before session;Repositioned;Limited activity within patient's tolerance            Progress Toward Goals  OT Goals(current goals can now be found in the care plan section)  Progress towards OT goals: (All education completed)  Acute Rehab OT Goals Patient Stated Goal: return home  Plan Discharge plan remains appropriate       AM-PAC OT "6 Clicks" Daily Activity     Outcome Measure   Help from another person eating meals?: None Help from  another person taking care of personal grooming?: A Little Help from another person toileting, which includes using toliet, bedpan, or urinal?: A Little Help from another person bathing (including washing, rinsing, drying)?: A Little Help from another person to put on and taking off regular upper body clothing?: A Little Help from another person to put on and taking off regular lower body clothing?: A Little 6 Click Score: 19    End of Session Equipment Utilized During Treatment: Gait belt;Rolling walker;Back brace  OT Visit Diagnosis: Unsteadiness on feet (R26.81);Pain Pain - part of body: (back)   Activity Tolerance Patient tolerated treatment well   Patient Left in chair;with call bell/phone within reach   Nurse Communication          Time: 6256-3893 OT Time Calculation (min): 35 min  Charges: OT General Charges $OT Visit: 1  Visit OT Treatments $Self Care/Home Management : 23-37 mins  Ignacia Palma, OTR/L Acute [employer] Pager [phone] Office [phone]      Evette Georges 12/23/2018, 12:26 PM

## 2018-12-23 NOTE — TOC Initial Note (Addendum)
Transition of Care [facility]) - Initial/Assessment Note    Patient Details  Name: Tyler Christian MRN: [id number] Date of Birth: [birth year]  Transition of Care [facility]) CM/SW Contact:    Tyler Plan, RN Phone Number: 12/23/2018, 10:21 AM  Clinical Narrative:      Tyler Christian with [facility] accepted home health referral.            Spoke to patient at bedside.   PCP DR Yolonda Kida at [facility]  Cell [phone]  Patient planning on staying with his sister Tyler Christian at discharge for 1.5 to 2 weeks, then returning to his home ( address in [facility]).   Sister's address is : [address], [place] , [place] [address].  Provided Medicare.gov list.  Patient interested in [facility]. Referral called to Tyler Christian , awaiting return call.  Patient already has a cane and walker at home.    Barriers to Discharge: No Barriers Identified   Patient Goals and CMS Choice Patient states their goals for this hospitalization and ongoing recovery are:: to go home  CMS Medicare.gov Compare Post Acute Care list provided to:: Patient Choice offered to / list presented to : Patient  Expected Discharge Christian and Services   Discharge Planning Services: CM Consult Post Acute Care Choice: Home Health   Expected Discharge Date: 12/23/18                   HH Arranged: PT, OT    Prior Living Arrangements/Services   Lives with:: Self Patient language and need for interpreter reviewed:: Yes Do you feel safe going back to the place where you live?: Yes      Need for Family Participation in Patient Care: Yes (Comment) Care giver support system in place?: Yes (comment) Current home services: DME(already has walker, and cane ) Criminal Activity/Legal Involvement Pertinent to Current Situation/Hospitalization: No - Comment as needed  Activities of Daily Living Home Assistive Devices/Equipment: CPAP, Walker (specify type), Cane (specify quad or straight)(braces for  legs) ADL Screening (condition at time of admission) Patient's cognitive ability adequate to safely complete daily activities?: Yes Is the patient deaf or have difficulty hearing?: No Does the patient have difficulty seeing, even when wearing glasses/contacts?: No Does the patient have difficulty concentrating, remembering, or making decisions?: No Patient able to express need for assistance with ADLs?: Yes Does the patient have difficulty dressing or bathing?: No Independently performs ADLs?: Yes (appropriate for developmental age) Does the patient have difficulty walking or climbing stairs?: Yes Weakness of Legs: Both Weakness of Arms/Hands: None  Permission Sought/Granted Permission sought to share information with : Case Manager Permission granted to share information with : Yes, Verbal Permission Granted  Share Information with NAME: Tyler Christian   Permission granted to share info w AGENCY: [facility],         Emotional Assessment Appearance:: Appears stated age Attitude/Demeanor/Rapport: Engaged, Gracious Affect (typically observed): Accepting Orientation: : Oriented to Self, Oriented to Place, Oriented to  Time, Oriented to Situation   Psych Involvement: No (comment)  Admission diagnosis:  Lumbar stenosis with neurogenic claudication Patient Active Problem List   Diagnosis Date Noted  . Lumbar stenosis with neurogenic claudication 12/15/2018   PCP:  Patient, No Pcp Per Pharmacy:   [facility] - [place], [place] - [address] [place] [address] Phone: [phone] Fax: [phone]     Social Determinants  of Health (SDOH) Interventions    Readmission Risk Interventions 30 Day Unplanned Readmission Risk Score     Admission (Current) from 12/15/2018 in [facility] [facility]  30 Day Unplanned Readmission Risk Score (%)  7 Filed at 12/23/2018 0801     This score is the patient's risk of an unplanned  readmission within 30 days of being discharged (0 -100%). The score is based on dignosis, age, lab data, medications, orders, and past utilization.   Low:  0-14.9   Medium: 15-21.9   High: 22-29.9   Extreme: 30 and above       No flowsheet data found.

## 2018-12-23 NOTE — Progress Notes (Signed)
   12/21/18 1109  OT Visit Information  Assistance Needed +1  History of Present Illness Pt is a 63 yo admitted for L 3-S1 fusion. PMHx: charcot marie tooth neuropathy, scoliosis, HTN, HLD, sleep apnea, bil RCR  Precautions  Precautions Back;Fall  Precaution Comments pt able to recall 3/3 precautions  Required Braces or Orthoses Spinal Brace  Spinal Brace Lumbar corset;Applied in sitting position  Pain Assessment  Pain Location back  Pain Descriptors / Indicators Sore;Aching  Cognition  Arousal/Alertness Awake/alert  Behavior During Therapy WFL for tasks assessed/performed  Overall Cognitive Status Within Functional Limits for tasks assessed  ADL  Overall ADL's  Needs assistance/impaired  Grooming Wash/dry hands;Supervision/safety;Standing  Lower Body Dressing Details (indicate cue type and reason) Min guard A sit>stand, S stand>sit; pt able to lower shoe to ground with reacher while shoe funnel already in it, he then stood, readjusted the shoe with reacher and lifted his RLE to place foot in shoe. He then uses a stomping motion to get toes to slide to front of shoe. He then sat down and was able to get his heel down into shoe and pulled out shoe funnel with string  Toilet Transfer Supervision/safety;Ambulation;RW;Comfort height toilet;Grab bars  Toileting- Architect and Hygiene Min guard;Sit to/from stand  Bed Mobility  Overal bed mobility Needs Assistance  Bed Mobility Sit to Sidelying;Rolling  Sit to sidelying Min assist  General bed mobility comments VCs to stay in side lying to go from sit>supine then roll over on back  Restrictions  Weight Bearing Restrictions No  Transfers  Overall transfer level Needs assistance  Equipment used Rolling walker (2 wheeled)  Transfers Sit to/from Stand  Sit to Stand Min guard  OT - End of Session  Equipment Utilized During Treatment Gait belt;Rolling walker;Back brace  Activity Tolerance Patient tolerated treatment well  Patient  left in bed;with call bell/phone within reach  OT Assessment/Plan  OT Plan Discharge plan remains appropriate  OT Visit Diagnosis Unsteadiness on feet (R26.81);Pain  Pain - part of body  (back)  OT Frequency (ACUTE ONLY) Min 2X/week  Follow Up Recommendations Home health OT;Supervision/Assistance - 24 hour  OT Equipment 3 in 1 bedside commode  AM-PAC OT "6 Clicks" Daily Activity Outcome Measure (Version 2)  Help from another person eating meals? 4  Help from another person taking care of personal grooming? 3  Help from another person toileting, which includes using toliet, bedpan, or urinal? 3  Help from another person bathing (including washing, rinsing, drying)? 3  Help from another person to put on and taking off regular upper body clothing? 3  Help from another person to put on and taking off regular lower body clothing? 3  6 Click Score 19  OT Goal Progression  Progress towards OT goals Progressing toward goals  OT Time Calculation  OT Start Time (ACUTE ONLY) 1012  OT Stop Time (ACUTE ONLY) 1051  OT Time Calculation (min) 39 min    Late entry for 12/20/2017 Ignacia Palma, OTR/L Acute Rehab Services Pager [phone] Office [phone]

## 2018-12-23 NOTE — Discharge Summary (Signed)
Physician Discharge Summary  Patient ID: Tyler Christian MRN: [id number] DOB/AGE: 62-02-1956 62 y.o.  Admit date: 12/15/2018 Discharge date: 12/23/2018  Admission Diagnoses: Lumbar scoliosis and stenosis lumbar spondylolisthesis L4-L5 neurogenic claudication, lumbar radiculopathy.  History of Charcot-Marie-Tooth  Discharge Diagnoses: Lumbar scoliosis and stenosis, lumbar spondylolisthesis L4-L5, neurogenic claudication, lumbar radiculopathy.  History of Charcot-Marie-Tooth neuropathy.  Urinary retention.  Acute blood loss anemia. Active Problems:   Lumbar stenosis with neurogenic claudication   Discharged Condition: fair  Hospital Course: Patient was admitted to undergo surgical decompression and stabilization from L3 to the sacrum.  Postoperatively had significant difficulties with constipation in addition to urinary retention.  This required placement of Foley catheter and a number of straight caths for several days until his residuals decreased.  He also had issues of pain management not being able to tolerate oral opioids.  He is discharged home on tramadol and Celebrex.  Incision remains clean and dry.  Patient did have degenerative scoliosis which was reduced with surgery.  Consults: None  Significant Diagnostic Studies: None  Treatments: Surgery surgical decompression L3-S1 with posterior lumbar interbody arthrodesis.  Discharge Exam: Blood pressure 117/68, pulse 71, temperature 98.9 F (37.2 C), temperature source Oral, resp. rate 18, height 5\' 8"  (1.727 m), weight 94.8 kg, SpO2 95 %. Incision is clean and dry motor function demonstrates bilateral weakness in distal lower extremities which is been chronic.  Disposition: Discharge disposition: 01-Home or Self Care       Discharge Instructions    Call MD for:  redness, tenderness, or signs of infection (pain, swelling, redness, odor or green/yellow discharge around incision site)   Complete by:  As directed    Call MD for:   severe uncontrolled pain   Complete by:  As directed    Call MD for:  temperature >100.4   Complete by:  As directed    Diet - low sodium heart healthy   Complete by:  As directed    Incentive spirometry RT   Complete by:  As directed    Increase activity slowly   Complete by:  As directed      Allergies as of 12/23/2018      Reactions   Penicillins Shortness Of Breath, Swelling   Did it involve swelling of the face/tongue/throat, SOB, or low BP? Yes Did it involve sudden or severe rash/hives, skin peeling, or any reaction on the inside of your mouth or nose? No Did you need to seek medical attention at a hospital or doctor's office? No When did it last happen?40 years ago If all above answers are "NO", may proceed with cephalosporin use.   Hydrocodone Other (See Comments)   Makes pt feel spaced out   Oxycodone Other (See Comments)   Makes pt feel spaced out      Medication List    STOP taking these medications   diclofenac 75 MG EC tablet Commonly known as:  VOLTAREN   ibuprofen 200 MG tablet Commonly known as:  ADVIL,MOTRIN   meloxicam 15 MG tablet Commonly known as:  MOBIC     TAKE these medications   acetaminophen 650 MG CR tablet Commonly known as:  TYLENOL Take 650 mg by mouth every 8 (eight) hours as needed for pain.   benazepril 10 MG tablet Commonly known as:  LOTENSIN Take 10 mg by mouth every evening.   celecoxib 200 MG capsule Commonly known as:  CELEBREX Take 1 capsule (200 mg total) by mouth 2 (two) times daily.   ciprofloxacin 500  MG tablet Commonly known as:  CIPRO Take 500 mg by mouth 2 (two) times daily. STARTED 12/06/2018 (20 TABS) 10 DAYS 12/16/2018   clindamycin 150 MG capsule Commonly known as:  CLEOCIN Take 150 mg by mouth 3 (three) times daily. STARTED MED 12/06/2018 (20 TABS) 12/13/2018 (COMPLETED)   gabapentin 300 MG capsule Commonly known as:  NEURONTIN Take 300 mg by mouth every morning.   multivitamin with minerals Tabs  tablet Take 1 tablet by mouth daily.   simvastatin 40 MG tablet Commonly known as:  ZOCOR Take 40 mg by mouth daily.   tamsulosin 0.4 MG Caps capsule Commonly known as:  FLOMAX Take 1 capsule (0.4 mg total) by mouth daily.   traMADol 50 MG tablet Commonly known as:  ULTRAM Take 1-2 tablets (50-100 mg total) by mouth every 6 (six) hours as needed for moderate pain or severe pain.        Signed: Shary Key Mckaela Howley 12/23/2018, 8:20 AM

## 2018-12-23 NOTE — Progress Notes (Signed)
Physical Therapy Treatment Patient Details Name: Tyler Christian MRN: [id number] DOB: 10-27-1955 Today's Date: 12/23/2018    History of Present Illness Pt is a 63 yo admitted for L 3-S1 fusion. PMHx: charcot marie tooth neuropathy, scoliosis, HTN, HLD, sleep apnea, bil RCR    PT Comments    Pt up ambulating in room upon arrival having just finished therapy with OT and requesting to walk with PT. Pt reported feeling better today and having walked three times yesterday throughout the day and sitting up in chair more. Pt progressed ambulation distance today with improved gait pattern and tolerance. Pt encouraged to continue ambulating multiple times throughout the day. Pt educated on activity progression once discharge home.   Follow Up Recommendations  Home health PT;Supervision/Assistance - 24 hour     Equipment Recommendations  None recommended by PT    Recommendations for Other Services       Precautions / Restrictions Precautions Precautions: Back;Fall Precaution Booklet Issued: Yes (comment) Precaution Comments: pt able to recall 3/3 precautions Required Braces or Orthoses: Spinal Brace Spinal Brace: Lumbar corset;Applied in sitting position Restrictions Weight Bearing Restrictions: No    Mobility  Bed Mobility               General bed mobility comments: pt up having just finished working with OT  Transfers                 General transfer comment: pt up in room having just finished with OT  Ambulation/Gait Ambulation/Gait assistance: Supervision Gait Distance (Feet): 700 Feet Assistive device: Rolling walker (2 wheeled) Gait Pattern/deviations: Step-through pattern;Decreased stride length;Trunk flexed Gait velocity: decreased   General Gait Details: pt continues to ambulate with guarded gait pattern but is improving, pt states that walking feels the best for him, pt requiring cuing for upright posture but pt self corrected two times during ambulation,  pt educated on continuing to ambulate with RW until he feels comfortable enough to no longer use walker   Stairs             Wheelchair Mobility    Modified Rankin (Stroke Patients Only)       Balance Overall balance assessment: Needs assistance Sitting-balance support: Feet supported;Bilateral upper extremity supported Sitting balance-Leahy Scale: Fair Sitting balance - Comments: pt able to don brace independently    Standing balance support: During functional activity;Bilateral upper extremity supported Standing balance-Leahy Scale: Poor Standing balance comment: reliant on UE support                            Cognition Arousal/Alertness: Awake/alert Behavior During Therapy: WFL for tasks assessed/performed Overall Cognitive Status: Within Functional Limits for tasks assessed                                        Exercises      General Comments        Pertinent Vitals/Pain Faces Pain Scale: Hurts a little bit Pain Location: back Pain Descriptors / Indicators: Sore Pain Intervention(s): Limited activity within patient's tolerance;Monitored during session;Premedicated before session    Home Living                      Prior Function            PT Goals (current goals can now be found in the care  plan section) Acute Rehab PT Goals Patient Stated Goal: return home PT Goal Formulation: With patient Time For Goal Achievement: 12/30/18 Potential to Achieve Goals: Good Progress towards PT goals: Progressing toward goals    Frequency    Min 5X/week      PT Plan Current plan remains appropriate    Co-evaluation              AM-PAC PT "6 Clicks" Mobility   Outcome Measure  Help needed turning from your back to your side while in a flat bed without using bedrails?: A Little Help needed moving from lying on your back to sitting on the side of a flat bed without using bedrails?: A Little Help needed moving  to and from a bed to a chair (including a wheelchair)?: A Little Help needed standing up from a chair using your arms (e.g., wheelchair or bedside chair)?: A Little Help needed to walk in hospital room?: A Little Help needed climbing 3-5 steps with a railing? : A Lot 6 Click Score: 17    End of Session Equipment Utilized During Treatment: Gait belt;Back brace Activity Tolerance: Patient tolerated treatment well Patient left: in chair;with call bell/phone within reach Nurse Communication: Mobility status PT Visit Diagnosis: Other symptoms and signs involving the nervous system (R29.898);Other abnormalities of gait and mobility (R26.89)     Time: 2683-4196 PT Time Calculation (min) (ACUTE ONLY): 14 min  Charges:  $Gait Training: 8-22 mins                     Roxane [place], [place] [phone]    Aniston Christman 12/23/2018, 10:16 AM

## 2018-12-26 MED FILL — Sodium Chloride IV Soln 0.9%: INTRAVENOUS | Qty: 2000 | Status: AC

## 2018-12-26 MED FILL — Heparin Sodium (Porcine) Inj 1000 Unit/ML: INTRAMUSCULAR | Qty: 30 | Status: AC

## 2020-08-26 IMAGING — CT CT L SPINE W/O CM
3 of 5 series · 12 of 33 positions shown, 14 images · non-contrast
Comparison: Office radiographs 06/09/2018. Lumbar MRI 08/16/2017
from [REDACTED] in [REDACTED] (no report).

CLINICAL DATA: Low back pain for 2 years with bilateral leg
numbness, weakness and tingling. No acute injury or prior relevant
surgery.

EXAM:
CT LUMBAR SPINE WITHOUT CONTRAST
TECHNIQUE: Multidetector CT imaging of the lumbar spine was performed without
intravenous contrast administration. Multiplanar CT image
reconstructions were also generated.

[Series 5: l-spine 2.00 br60 s3 sag sag bone · sagittal · 0.36mm/px · 5 of 92 slices shown, 6 images]
[im 31/92  bone]
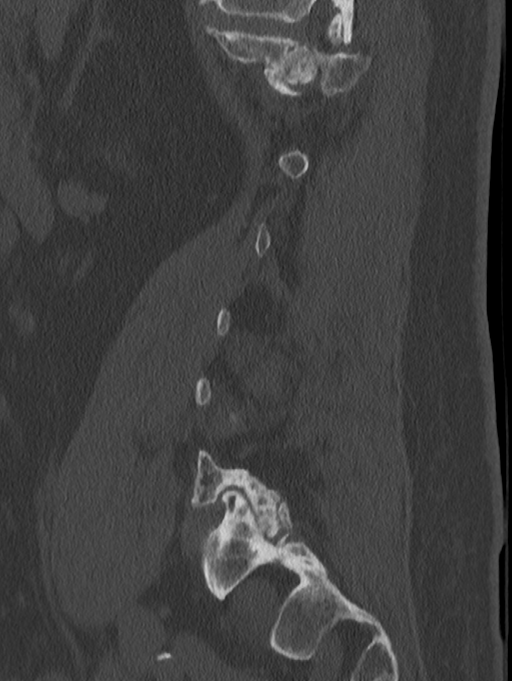
[im 38/92  bone]
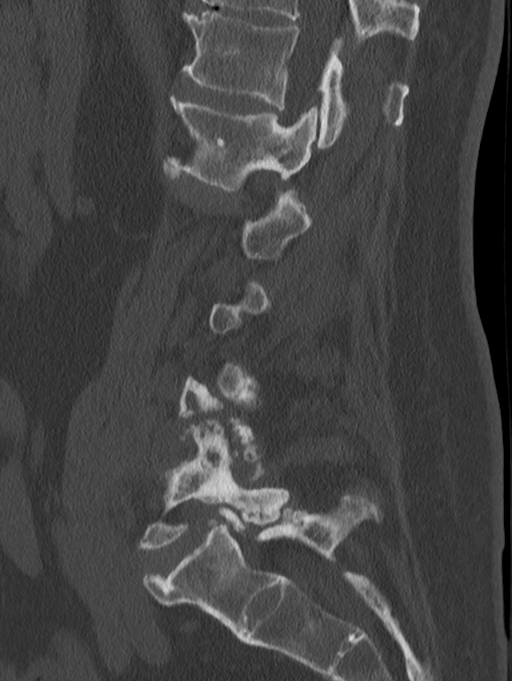
[im 46/92  soft-tissue]
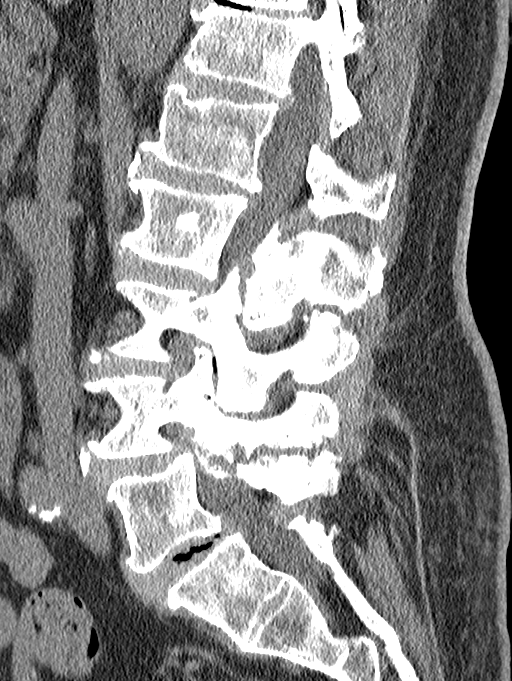
[im 46/92  bone]
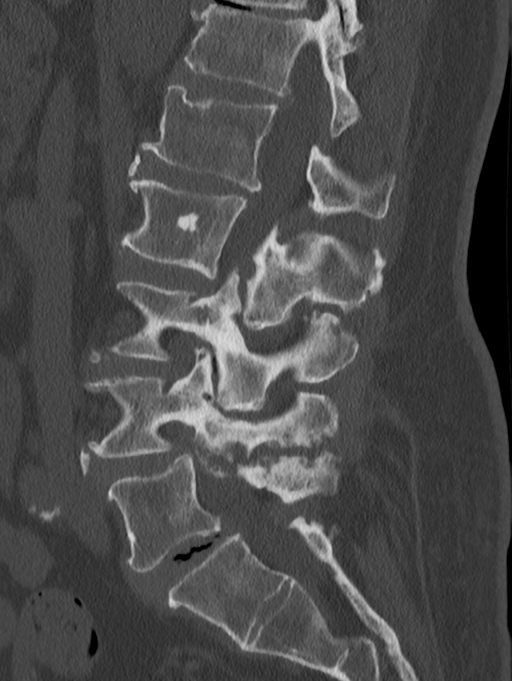
[im 54/92  bone]
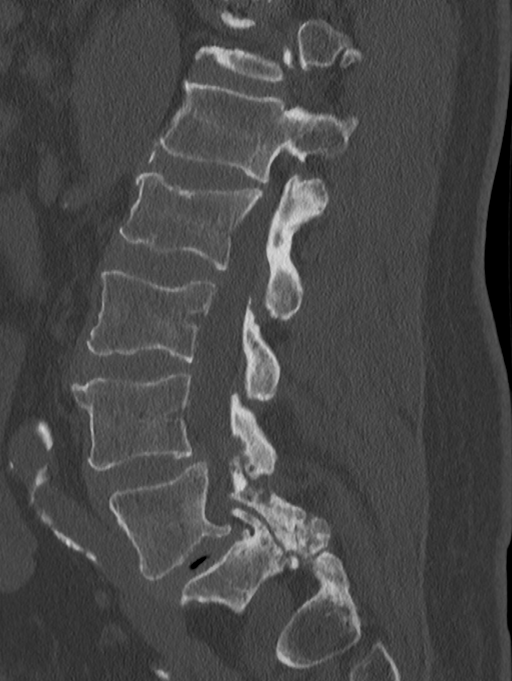
[im 61/92  bone]
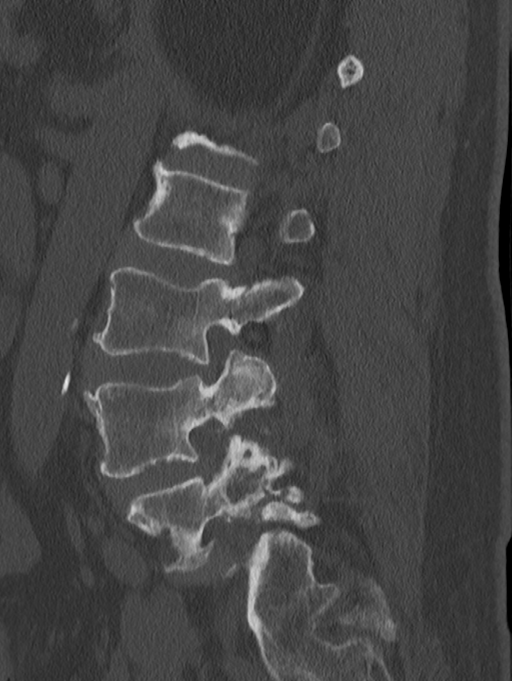

[Series 7: l-spine 2.00 br60 s3 cor cor bone · coronal · 0.36mm/px · 3 of 92 slices shown]
[im 19/92  bone]
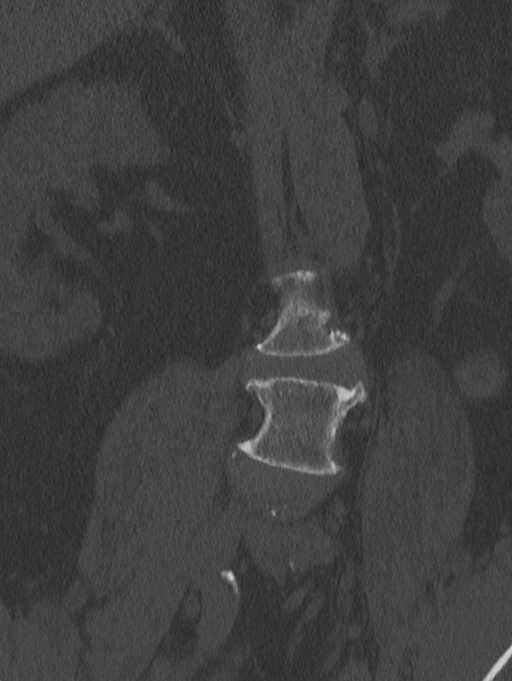
[im 37/92  bone]
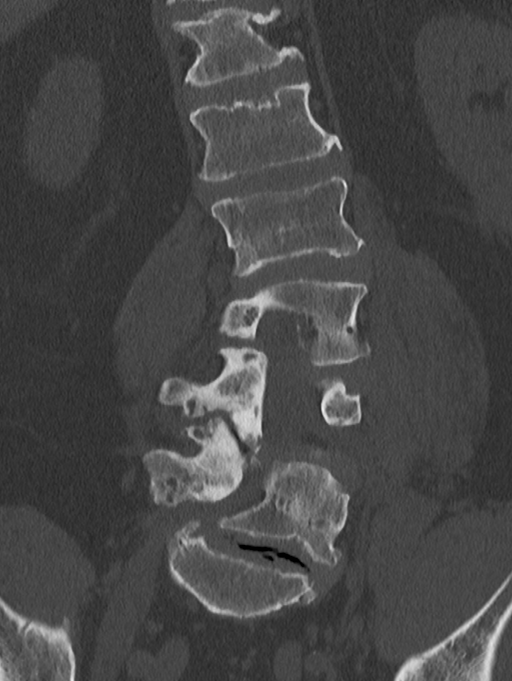
[im 55/92  bone]
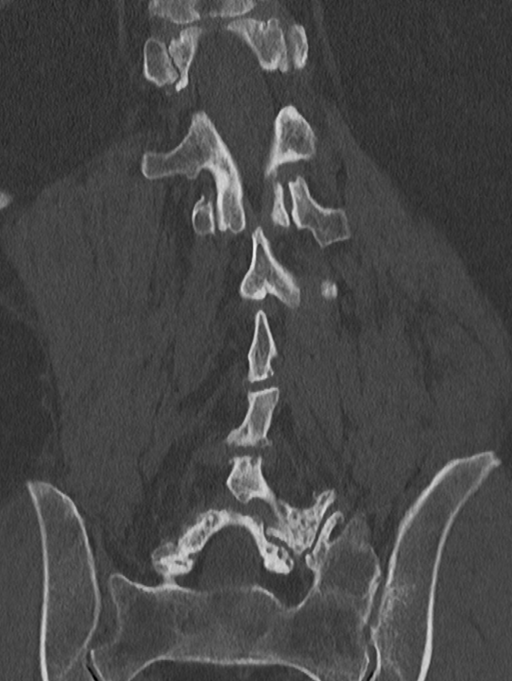

[Series 11: l-spine 0.60 br40 s3 thins st · axial · 0.36mm/px · z∈[+1575,+1759]mm · 4 of 410 slices shown, 5 images]
[im 52/410  soft-tissue]
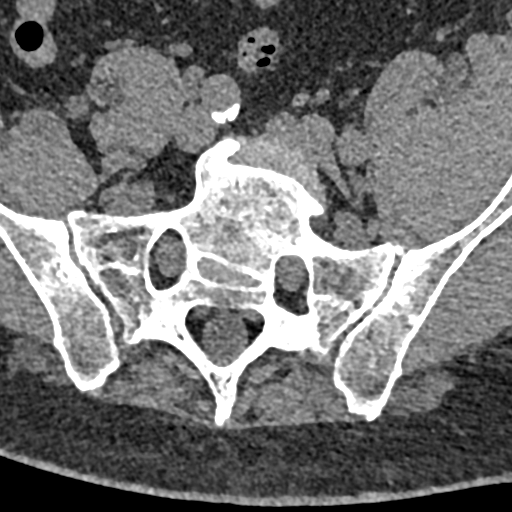
[im 52/410  bone]
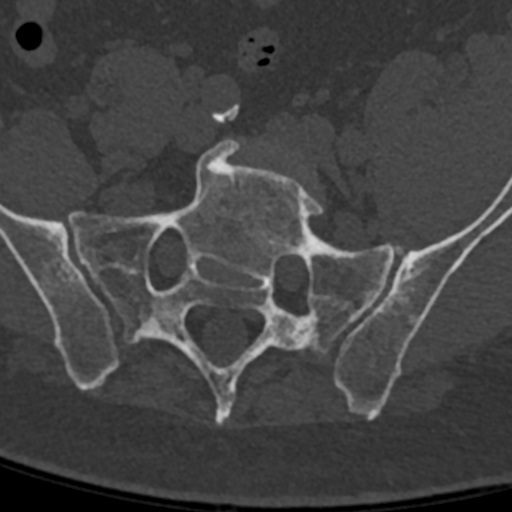
[im 154/410  bone]
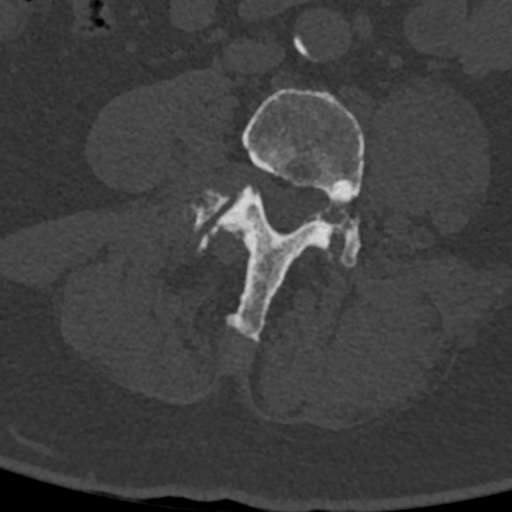
[im 256/410  bone]
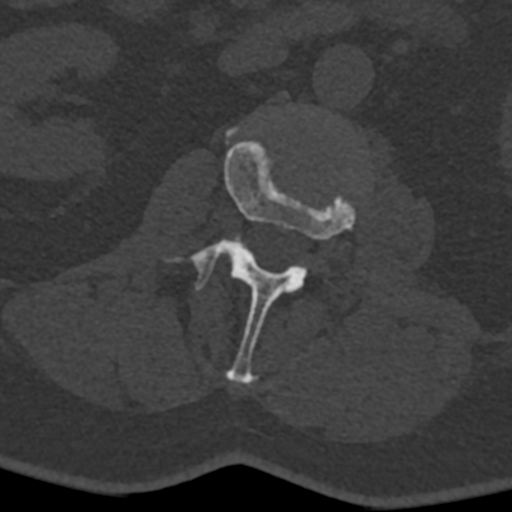
[im 358/410  bone]
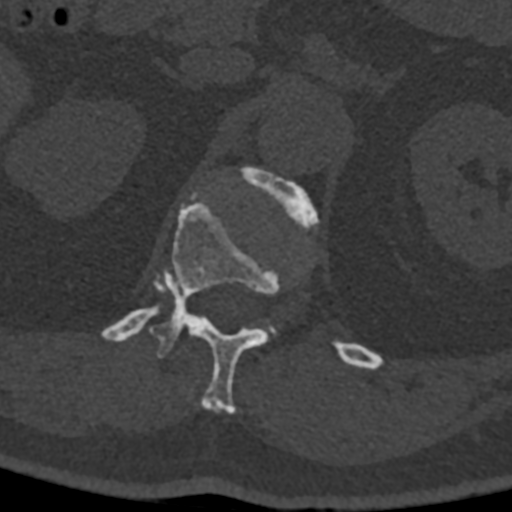

[12 of 33 positions shown; findings below may reference images not displayed]

FINDINGS: Segmentation: Normal.

Alignment: There is a convex left scoliosis measuring 36 degrees.
The lateral alignment is near anatomic. There is a slight
degenerative anterolisthesis at L5-S1.

Vertebrae: No evidence of acute fracture or traumatic subluxation.
There is a stable bone island in the L2 vertebral body. There is
prominent facet disease inferiorly. Mild sacroiliac degenerative
changes are present bilaterally.

Paraspinal and other soft tissues: No acute findings. Mild aortic
and branch vessel atherosclerosis. Small nonobstructing right renal
calculi.

Disc levels:

T11-12: Disc degeneration with annular disc bulging and endplate
osteophytes asymmetric to the left. Associated vacuum disc
phenomenon. No significant spinal stenosis or nerve root
encroachment.

T12-L1: Mild disc bulging and endplate osteophytes asymmetric to the
left. No significant spinal stenosis or nerve root encroachment.

L1-2: Stable disc bulging and shallow right paracentral disc
protrusion. No significant spinal stenosis or nerve root
encroachment.

L2-3: Stable mild disc bulging, facet and ligamentous hypertrophy.
No significant spinal stenosis or nerve root encroachment.

L3-4: There is disc bulging asymmetric to the right and mild facet
and ligamentous hypertrophy. There is stable mild spinal stenosis
and mild asymmetric narrowing of the right lateral recess. No
significant foraminal compromise.

L4-5: There is annular disc bulging with advanced facet and
ligamentous hypertrophy. There are erosions and fragmentation of the
facet joints bilaterally, worse on the right. Previous MRI
demonstrated associated synovial cysts projecting medially from the
right facet joint. These factors contribute to moderate to severe
spinal stenosis with asymmetric narrowing of the right lateral
recess. There is also mild asymmetric right foraminal narrowing.
Prominent interspinous erosions are also noted (Baastrup disease).

L5-S1: Disc degeneration with vacuum phenomenon and annular disc
bulging. There is advanced bilateral facet hypertrophy with erosions
and osteophytes. These factors contribute to mild-to-moderate spinal
stenosis with narrowing of the lateral recesses and foramina
bilaterally, similar to previous MRI.
IMPRESSION: 1. Moderate convex left lumbar scoliosis.
2. Mild spinal stenosis with asymmetric narrowing of the right
lateral recess at L3-4.
3. Moderate to severe multifactorial spinal stenosis at L4-5.
Associated severe facet arthropathy with synovial cysts projecting
medially from the right facet joint on previous MRI and Baastrup
disease.
4. Advanced facet arthropathy at L5-S1, contributing to
mild-to-moderate spinal stenosis with narrowing the lateral recesses
and foramina bilaterally.
